# Patient Record
Sex: Male | Born: 1965 | Race: White | Hispanic: No | Marital: Married | Smoking: Never smoker
Health system: Southern US, Community
[De-identification: ages and names within clinical notes are randomized; demographics above are authoritative.]

## PROBLEM LIST (undated history)

## (undated) DIAGNOSIS — K5792 Diverticulitis of intestine, part unspecified, without perforation or abscess without bleeding: Secondary | ICD-10-CM

## (undated) DIAGNOSIS — I10 Essential (primary) hypertension: Secondary | ICD-10-CM

## (undated) DIAGNOSIS — E7800 Pure hypercholesterolemia, unspecified: Secondary | ICD-10-CM

## (undated) DIAGNOSIS — G43909 Migraine, unspecified, not intractable, without status migrainosus: Secondary | ICD-10-CM

## (undated) DIAGNOSIS — N200 Calculus of kidney: Secondary | ICD-10-CM

## (undated) HISTORY — PX: SHOULDER SURGERY: SHX246

## (undated) HISTORY — DX: Migraine, unspecified, not intractable, without status migrainosus: G43.909

## (undated) HISTORY — PX: OTHER SURGICAL HISTORY: SHX169

## (undated) HISTORY — PX: ROTATOR CUFF REPAIR: SHX139

## (undated) HISTORY — DX: Diverticulitis of intestine, part unspecified, without perforation or abscess without bleeding: K57.92

## (undated) HISTORY — DX: Essential (primary) hypertension: I10

## (undated) HISTORY — DX: Calculus of kidney: N20.0

## (undated) HISTORY — DX: Pure hypercholesterolemia, unspecified: E78.00

---

## 2005-01-25 ENCOUNTER — Other Ambulatory Visit: Payer: Self-pay

## 2005-01-26 ENCOUNTER — Inpatient Hospital Stay: Payer: Self-pay | Admitting: Surgery

## 2005-03-14 ENCOUNTER — Ambulatory Visit: Payer: Self-pay | Admitting: Anesthesiology

## 2005-09-23 ENCOUNTER — Emergency Department: Payer: Self-pay | Admitting: Emergency Medicine

## 2005-09-23 ENCOUNTER — Other Ambulatory Visit: Payer: Self-pay

## 2008-11-17 ENCOUNTER — Ambulatory Visit (HOSPITAL_BASED_OUTPATIENT_CLINIC_OR_DEPARTMENT_OTHER): Admission: RE | Admit: 2008-11-17 | Discharge: 2008-11-17 | Payer: Self-pay | Admitting: Orthopedic Surgery

## 2009-07-27 ENCOUNTER — Emergency Department: Payer: Self-pay | Admitting: Emergency Medicine

## 2009-08-03 ENCOUNTER — Ambulatory Visit: Payer: Self-pay | Admitting: Urology

## 2010-11-21 LAB — BASIC METABOLIC PANEL
Calcium: 9.2 mg/dL (ref 8.4–10.5)
GFR calc non Af Amer: >60 mL/min (ref >60)
Glucose, Bld: 94 mg/dL (ref 70–99)
Potassium: 4.8 mEq/L (ref 3.5–5.1)
Sodium: 141 mEq/L (ref 135–145)

## 2010-11-21 LAB — POCT HEMOGLOBIN-HEMACUE: Hemoglobin: 15.1 g/dL (ref 13.0–17.0)

## 2010-12-25 NOTE — Op Note (Signed)
NAME:  Francisco Harvey, Francisco Harvey NO.:  [id number]   MEDICAL RECORD NO.:  [id number]          PATIENT TYPE:  AMB   LOCATION:  DSC                          FACILITY:  [facility]   PHYSICIAN:  Loreta Ave, M.D. DATE OF BIRTH:  [birth year]   DATE OF PROCEDURE:  DATE OF DISCHARGE:                               OPERATIVE REPORT   PREOPERATIVE DIAGNOSES:  Right shoulder impingement, rotator cuff tear,  distal clavicle osteolysis, posterior labrum tear.   POSTOPERATIVE DIAGNOSIS:  Right shoulder impingement, rotator cuff tear,  distal clavicle osteolysis, posterior labrum tear.   PROCEDURE:  Right shoulder exam under anesthesia, arthroscopy with  debridement of labrum and rotator cuff.  Acromioplasty, coracoacromial  ligament release, excision of distal clavicle, and arthroscopic assisted  rotator cuff repair with fiber weave suture x2, swivel lock anchors x2.   SURGEON:  Loreta Ave, MD   ASSISTANT:  Genene Churn. Barry Dienes, PA present throughout the entire case and  necessary for timely completion of the procedure.   ANESTHESIA:  General   BLOOD LOSS:  Minimal.   SPECIMENS:  None.   CULTURES:  None.   COMPLICATIONS:  None.   DRESSINGS:  Soft compressive with shoulder immobilizer.   PROCEDURE IN DETAIL:  The patient brought to the operating room, placed  on the operating table in supine position.  After adequate anesthesia  had been obtained, shoulder examined.  Full motion, stable shoulder.  Placed in beach-chair position on the shoulder positioner, and prepped  and draped in usual sterile fashion.  Three portals, anterior,  posterior, and lateral.  Shoulder entered with blunt obturator.  Arthroscope introduced, shoulder distended and inspected.  Posterior  labrum tear debrided.  Articular cartilage, biceps tendon, biceps  anchor, capsular ligamentous structure intact.  Bottom of the cuff very  thin but the capsular layer still intact.  Cannula redirected  subacromially.  Functional full-thickness tear in entire supraspinatus  tendon.  The capsule just to the bottom was the only thing intact.  Debrided, mobilized for repair.  Tuberosity to roughened bone.  Acromioplasty from a type 3 to a type 1 acromion with shaver and high-  speed bur.  CA ligament released with cautery.  Distal clavicle grade 4  changes marked spurring.  Periarticular spurs and lateral centimeter of  clavicle resected.  Adequacy of decompression confirmed viewing from all  portals.  Cannula lateral anteriorly.  Cuff mobilized.  Captured with  two horizontal mattress fiber weave sutures with a scorpion device.  Brought out sequentially anterior and then back lateral.  Firmly  anchored down the tuberosity with two swivel lock anchors.  At  completion, adequacy of repair  confirmed.  Instruments and fluid removed.  Portals of shoulder and  bursa injected with Marcaine.  Portals closed with 4-0 nylon.  Sterile  compressive dressing applied.  Anesthesia reversed.  Brought to the  recovery room.  Tolerated surgery well.  No complications.      Loreta Ave, M.D.  Electronically Signed     DFM/MEDQ  D:  11/17/2008  T:  11/18/2008  Job:  [id number]

## 2014-03-10 ENCOUNTER — Other Ambulatory Visit: Payer: Self-pay | Admitting: *Deleted

## 2014-03-10 ENCOUNTER — Ambulatory Visit (INDEPENDENT_AMBULATORY_CARE_PROVIDER_SITE_OTHER): Payer: BC Managed Care – PPO

## 2014-03-10 ENCOUNTER — Encounter: Payer: Self-pay | Admitting: Podiatry

## 2014-03-10 ENCOUNTER — Ambulatory Visit (INDEPENDENT_AMBULATORY_CARE_PROVIDER_SITE_OTHER): Payer: BC Managed Care – PPO | Admitting: Podiatry

## 2014-03-10 VITALS — BP 131/76 | HR 55 | Resp 16 | Ht 70.0 in | Wt 190.0 lb

## 2014-03-10 DIAGNOSIS — M779 Enthesopathy, unspecified: Secondary | ICD-10-CM

## 2014-03-10 DIAGNOSIS — M21619 Bunion of unspecified foot: Secondary | ICD-10-CM

## 2014-03-10 DIAGNOSIS — M205X9 Other deformities of toe(s) (acquired), unspecified foot: Secondary | ICD-10-CM

## 2014-03-10 DIAGNOSIS — M205X1 Other deformities of toe(s) (acquired), right foot: Secondary | ICD-10-CM

## 2014-03-10 DIAGNOSIS — M2010 Hallux valgus (acquired), unspecified foot: Secondary | ICD-10-CM

## 2014-03-10 DIAGNOSIS — M898X9 Other specified disorders of bone, unspecified site: Secondary | ICD-10-CM

## 2014-03-10 NOTE — Progress Notes (Signed)
   Subjective:    Patient ID: Francisco Harvey, male    DOB: Sep 04, 1965, 48 y.o.   MRN: [id number]  HPI Comments: Seen dr Al Corpushyatt years ago for this left foot, the toe was hurting in the joint and now recently it has started in the right foot. i am ok to run but walking hurts   Foot Pain Associated symptoms include headaches.      Review of Systems  Neurological: Positive for headaches.  All other systems reviewed and are negative.      Objective:   Physical Exam: I have reviewed her past medical history medications allergies surgeries social history and review of systems. Pulses are strongly palpable bilateral. Neurologic sensorium is intact per Semmes-Weinstein monofilament. Deep tendon reflexes are intact bilateral and muscle strength is 5 over 5 dorsiflexors plantar flexors inverters everters all intrinsic musculature is intact. Orthopedic evaluation demonstrates all joints distal to the ankle a full range of motion without crepitation. This is with the exception of the first metatarsophalangeal joint of the left foot which is limited to approximately 10 of dorsiflexion. He also has pain on in range of motion of this joint. The right foot does demonstrate mild edema overlying the first metatarsophalangeal joint of the right foot. It also demonstrates a near pain on range of motion and on end range of motion. Radiographic evaluation of the bilateral foot does demonstrates severe osteoarthritis and joint space narrowing and subchondral sclerosis dorsal spurring first metatarsophalangeal joint left foot. Currently we have no joint space loss to the right foot only a mild elevated first metatarsal resulting in a hallux limitus.        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  Plan: Discussed etiology pathology conservative versus surgical therapies at this point in time we  discussed injecting the first metatarsophalangeal joint with Kenalog and local anesthetic he declined this. He stated that he would prefer ibuprofen over an injection. I will followup with him as needed.

## 2016-03-05 ENCOUNTER — Other Ambulatory Visit: Payer: Self-pay | Admitting: Physician Assistant

## 2016-03-05 NOTE — Telephone Encounter (Signed)
Is this your patient?  Thanks,   -Vernona Rieger

## 2016-03-20 ENCOUNTER — Other Ambulatory Visit: Payer: Self-pay | Admitting: Physician Assistant

## 2018-03-10 ENCOUNTER — Emergency Department
Admission: EM | Admit: 2018-03-10 | Discharge: 2018-03-10 | Disposition: A | Discharge status: Home / Self Care | Payer: BLUE CROSS/BLUE SHIELD | Attending: Emergency Medicine | Admitting: Emergency Medicine

## 2018-03-10 ENCOUNTER — Emergency Department: Payer: BLUE CROSS/BLUE SHIELD

## 2018-03-10 ENCOUNTER — Other Ambulatory Visit: Payer: Self-pay

## 2018-03-10 DIAGNOSIS — Y9389 Activity, other specified: Secondary | ICD-10-CM | POA: Diagnosis not present

## 2018-03-10 DIAGNOSIS — X500XXA Overexertion from strenuous movement or load, initial encounter: Secondary | ICD-10-CM | POA: Insufficient documentation

## 2018-03-10 DIAGNOSIS — I10 Essential (primary) hypertension: Secondary | ICD-10-CM | POA: Insufficient documentation

## 2018-03-10 DIAGNOSIS — Z79899 Other long term (current) drug therapy: Secondary | ICD-10-CM | POA: Insufficient documentation

## 2018-03-10 DIAGNOSIS — Y929 Unspecified place or not applicable: Secondary | ICD-10-CM | POA: Diagnosis not present

## 2018-03-10 DIAGNOSIS — S39012A Strain of muscle, fascia and tendon of lower back, initial encounter: Secondary | ICD-10-CM | POA: Diagnosis not present

## 2018-03-10 DIAGNOSIS — S3992XA Unspecified injury of lower back, initial encounter: Secondary | ICD-10-CM | POA: Diagnosis present

## 2018-03-10 DIAGNOSIS — Y999 Unspecified external cause status: Secondary | ICD-10-CM | POA: Insufficient documentation

## 2018-03-10 MED ORDER — PREDNISONE 10 MG (21) PO TBPK: ORAL_TABLET | ORAL | 0 refills | Status: DC

## 2018-03-10 MED ORDER — MORPHINE SULFATE (PF) 4 MG/ML IV SOLN
4.0000 mg | Freq: Once | INTRAVENOUS | Status: DC
Start: 2018-03-10 — End: 2018-03-10
  Filled 2018-03-10: qty 1

## 2018-03-10 MED ORDER — TRAMADOL HCL 50 MG PO TABS: 50.0000 mg | ORAL_TABLET | Freq: Four times a day (QID) | ORAL | 0 refills | Status: DC | PRN

## 2018-03-10 MED ORDER — BACLOFEN 10 MG PO TABS: 10.0000 mg | ORAL_TABLET | Freq: Every day | ORAL | 1 refills | Status: AC

## 2018-03-10 MED ORDER — KETOROLAC TROMETHAMINE 30 MG/ML IJ SOLN
30.0000 mg | Freq: Once | INTRAMUSCULAR | Status: AC
  Administered 2018-03-10: 30 mg via INTRAVENOUS
  Filled 2018-03-10: qty 1

## 2018-03-10 MED ORDER — ORPHENADRINE CITRATE 30 MG/ML IJ SOLN
60.0000 mg | Freq: Two times a day (BID) | INTRAMUSCULAR | Status: DC
  Administered 2018-03-10: 60 mg via INTRAVENOUS
  Filled 2018-03-10: qty 2

## 2018-03-10 MED ORDER — ONDANSETRON HCL 4 MG/2ML IJ SOLN
4.0000 mg | Freq: Once | INTRAMUSCULAR | Status: DC
  Filled 2018-03-10: qty 2

## 2018-03-10 NOTE — Discharge Instructions (Addendum)
Follow-up with your regular doctor if not better in 5 to 7 days.  Apply ice to the lower back.  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  You may return on Sunday if you are improving.  Be careful with lifting and try to work on your core muscles.  Consider chiropractor if there is no improvement in 1 to 2 days

## 2018-03-10 NOTE — ED Triage Notes (Signed)
Pt states he was bending and reaching for something about an hour PTA and is having severe pain to the lower back. States he has had issues with his back off and on but never required surgery.

## 2018-03-10 NOTE — ED Notes (Signed)
See triage note  Presents with lower back pain this am  States he developed a sharp pain in back while reaching for paper in his bathroom  States he was not able to stand on his own

## 2018-03-10 NOTE — ED Provider Notes (Signed)
[facility] Emergency Department Provider Note  ____________________________________________   First MD Initiated Contact with Patient 03/10/18 [phone]     (approximate)  I have reviewed the triage vital signs and the nursing notes.   HISTORY  Chief Complaint Back Pain    HPI Horace PorteousDonald L Yeomans is a 52 y.o. male resents emergency department with C/o low back pain for 1 day, positive known injury, states he was sitting on the toilet and reached over for the toilet paper and his back went out.  His son had to help him off the toilet.  Pain is worse with movement, increased with bending over, better with standing, denies numbness, tingling, or changes in bowel/urinary habits,  Using otc meds without relief Remainder ros neg   Past Medical History:  Diagnosis Date  . Diverticulitis   . Hypertension   . Migraines     There are no active problems to display for this patient.   Past Surgical History:  Procedure Laterality Date  . knee surgery    . ROTATOR CUFF REPAIR    . SHOULDER SURGERY      Prior to Admission medications   Medication Sig Start Date End Date Taking? Authorizing Provider  atenolol (TENORMIN) 50 MG tablet Take 50 mg by mouth daily.   Yes [provider]  atorvastatin (LIPITOR) 10 MG tablet Take 10 mg by mouth daily.   Yes [provider]  SUMAtriptan (IMITREX) 100 MG tablet Take 100 mg by mouth every 2 (two) hours as needed for migraine. May repeat in 2 hours if headache persists or recurs.   Yes [provider]    Allergies Patient has no known allergies.  No family history on file.  Social History Social History   Tobacco Use  . Smoking status: Never Smoker  . Smokeless tobacco: Never Used  Substance Use Topics  . Alcohol use: Not Currently  . Drug use: Not Currently    Review of Systems  Constitutional: No fever/chills Eyes: No visual changes. ENT: No sore throat. Respiratory: Denies  cough Genitourinary: Negative for dysuria. Musculoskeletal: Positive for back pain. Skin: Negative for rash.    ____________________________________________   PHYSICAL EXAM:  VITAL SIGNS: ED Triage Vitals  Enc Vitals Group     BP 03/10/18 0932 (!) 147/93     Pulse Rate 03/10/18 0932 60     Resp 03/10/18 0932 18     Temp 03/10/18 0932 97.8 F (36.6 C)     Temp Source 03/10/18 0932 Oral     SpO2 03/10/18 0932 96 %     Weight 03/10/18 0929 200 lb (90.7 kg)     Height 03/10/18 0929 5\' 10"  (1.778 m)     Head Circumference --      Peak Flow --      Pain Score 03/10/18 0928 4     Pain Loc --      Pain Edu? --      Excl. in GC? --     Constitutional: Alert and oriented. Well appearing and in no acute distress. Eyes: Conjunctivae are normal.  Head: Atraumatic. Nose: No congestion/rhinnorhea. Mouth/Throat: Mucous membranes are moist.   Neck:  supple no lymphadenopathy noted Cardiovascular: Normal rate, regular rhythm. Heart sounds are normal Respiratory: Normal respiratory effort.  No retractions, lungs c t a  Abd: soft nontender bs normal all 4 quad GU: deferred Musculoskeletal: FROM all extremities, warm and well perfused.  Decreased rom of back due to discomfort, lumbar spine tender  at L5 area, negative Slr, full strength in great toes b/l, full strength in lower legs, n/v intact Neurologic:  Normal speech and language.  Skin:  Skin is warm, dry and intact. No rash noted. Psychiatric: Mood and affect are normal. Speech and behavior are normal.  ____________________________________________   LABS (all labs ordered are listed, but only abnormal results are displayed)  Labs Reviewed - No data to display ____________________________________________   ____________________________________________  RADIOLOGY  X-ray of the lumbar spine shows some degenerative changes at L5-S1 but no acute  abnormality  ____________________________________________   PROCEDURES  Procedure(s) performed: Saline lock, Toradol 30 mg IV, Norflex 60 mg IV, morphine 4 mg IV and Zofran 4 mg IV  Procedures    ____________________________________________   INITIAL IMPRESSION / ASSESSMENT AND PLAN / ED COURSE  Pertinent labs & imaging results that were available during my care of the patient were reviewed by me and considered in my medical decision making (see chart for details).   Patient is a 52 year old male presents emergency department complaining of low back pain.  He states he was sitting on the toilet and reached over for the toilet paper in his back went out.  His son had to help him off the toilet.  He has had increased pain with movement.  Relieved by standing.  He denies numbness or tingling in the extremities.  On physical exam patient appears well.  The lumbar spine is mildly tender to palpation around L5.  He has decreased range of motion basically due to discomfort.  Negative straight leg raise.  Full strength in the lower extremities.  He is able to ambulate without a foot drop.  X-ray of the lumbar spine shows degenerative changes but no acute abnormality  Patient was given Toradol 30 mg IV and Norflex 60 mg IV.  He states he does not really see any difference after this medication.  He was given morphine 4 mg IV and Zofran 4 mg IV.  Discussed all of the x-ray results and the course of treatment with the patient.  He will be given a steroid pack and muscle relaxer.  Tramadol for pain as needed.  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  Told him I have concerns about him doing any heavy lifting until he returns to his job on Sunday for his regular shift.  He states he understands will comply.  He was discharged in stable condition.     As part of my medical decision making, I reviewed the following data within the electronic MEDICAL RECORD NUMBER Nursing notes  reviewed and incorporated, Old chart reviewed, Radiograph reviewed x-ray lumbar spine is negative, Notes from prior ED visits and [place] Controlled Substance Database  ____________________________________________   FINAL CLINICAL IMPRESSION(S) / ED DIAGNOSES  Final diagnoses:  Strain of lumbar region, initial encounter      NEW MEDICATIONS STARTED DURING THIS VISIT:  Current Discharge Medication List       Note:  This document was prepared using Dragon voice recognition software and may include unintentional dictation errors.     Faythe Ghee, PA-C 03/10/18 1205    Schaevitz, Myra Rude, MD 03/10/18 [phone]

## 2019-05-12 ENCOUNTER — Ambulatory Visit: Payer: Self-pay

## 2019-05-12 DIAGNOSIS — Z23 Encounter for immunization: Secondary | ICD-10-CM

## 2019-05-27 ENCOUNTER — Other Ambulatory Visit: Payer: Self-pay

## 2019-05-27 ENCOUNTER — Ambulatory Visit: Payer: Self-pay

## 2019-05-27 DIAGNOSIS — Z0000 Encounter for general adult medical examination without abnormal findings: Secondary | ICD-10-CM

## 2019-05-27 LAB — POCT URINALYSIS DIPSTICK
Bilirubin, UA: NEGATIVE
Blood, UA: NEGATIVE
Glucose, UA: NEGATIVE
Ketones, UA: NEGATIVE
Leukocytes, UA: NEGATIVE
Nitrite, UA: NEGATIVE
Protein, UA: NEGATIVE
Spec Grav, UA: 1.02 (ref 1.010–1.025)
Urobilinogen, UA: 0.2 E.U./dL
pH, UA: 6 (ref 5.0–8.0)

## 2019-05-28 LAB — CMP12+LP+TP+TSH+6AC+PSA+CBC…
ALT: 31 IU/L (ref 0–44)
AST: 23 IU/L (ref 0–40)
Alkaline Phosphatase: 87 IU/L (ref 39–117)
BUN/Creatinine Ratio: 18 (ref 9–20)
Basos: 0 %
Bilirubin Total: 0.5 mg/dL (ref 0.0–1.2)
Calcium: 8.6 mg/dL — ABNORMAL LOW (ref 8.7–10.2)
Chol/HDL Ratio: 3.9 ratio (ref 0.0–5.0)
Creatinine, Ser: 0.89 mg/dL (ref 0.76–1.27)
Eos: 2 %
Free Thyroxine Index: 1.2 (ref 1.2–4.9)
GFR calc Af Amer: 113 mL/min/{1.73_m2} (ref >59)
GGT: 27 IU/L (ref 0–65)
Globulin, Total: 2.4 g/dL (ref 1.5–4.5)
Glucose: 113 mg/dL — ABNORMAL HIGH (ref 65–99)
HDL: 44 mg/dL (ref >39)
Iron: 116 ug/dL (ref 38–169)
LDH: 162 IU/L (ref 121–224)
Lymphocytes Absolute: 1.3 10*3/uL (ref 0.7–3.1)
Lymphs: 30 %
MCHC: 34.3 g/dL (ref 31.5–35.7)
Monocytes Absolute: 0.4 10*3/uL (ref 0.1–0.9)
Monocytes: 9 %
Phosphorus: 3 mg/dL (ref 2.8–4.1)
Potassium: 4.6 mmol/L (ref 3.5–5.2)
Prostate Specific Ag, Serum: 0.9 ng/mL (ref 0.0–4.0)
RBC: 4.69 x10E6/uL (ref 4.14–5.80)
RDW: 12.2 % (ref 11.6–15.4)
Total Protein: 6.8 g/dL (ref 6.0–8.5)
Uric Acid: 5.1 mg/dL (ref 3.7–8.6)
VLDL Cholesterol Cal: 19 mg/dL (ref 5–40)
WBC: 4.4 10*3/uL (ref 3.4–10.8)

## 2019-05-28 LAB — CMP12+LP+TP+TSH+6AC+PSA+CBC?
Albumin/Globulin Ratio: 1.8 (ref 1.2–2.2)
Albumin: 4.4 g/dL (ref 3.8–4.9)
BUN: 16 mg/dL (ref 6–24)
Basophils Absolute: 0 10*3/uL (ref 0.0–0.2)
Chloride: 104 mmol/L (ref 96–106)
Cholesterol, Total: 171 mg/dL (ref 100–199)
EOS (ABSOLUTE): 0.1 10*3/uL (ref 0.0–0.4)
Estimated CHD Risk: 0.7 times avg. (ref 0.0–1.0)
GFR calc non Af Amer: 98 mL/min/{1.73_m2} (ref >59)
Hematocrit: 43.7 % (ref 37.5–51.0)
Hemoglobin: 15 g/dL (ref 13.0–17.7)
Immature Grans (Abs): 0 10*3/uL (ref 0.0–0.1)
Immature Granulocytes: 0 %
LDL Chol Calc (NIH): 108 mg/dL — ABNORMAL HIGH (ref 0–99)
MCH: 32 pg (ref 26.6–33.0)
MCV: 93 fL (ref 79–97)
Neutrophils Absolute: 2.6 10*3/uL (ref 1.4–7.0)
Neutrophils: 59 %
Platelets: 205 10*3/uL (ref 150–450)
Sodium: 138 mmol/L (ref 134–144)
T3 Uptake Ratio: 27 % (ref 24–39)
T4, Total: 4.3 ug/dL — ABNORMAL LOW (ref 4.5–12.0)
TSH: 2.3 u[IU]/mL (ref 0.450–4.500)
Triglycerides: 105 mg/dL (ref 0–149)

## 2019-06-17 ENCOUNTER — Telehealth: Payer: Self-pay

## 2019-06-17 NOTE — Telephone Encounter (Signed)
Patient called to request refill of blood pressure medication.  NP will call bp meds into pharmacy for patient.

## 2019-06-18 ENCOUNTER — Telehealth: Payer: Self-pay | Admitting: Physician Assistant

## 2019-07-07 ENCOUNTER — Other Ambulatory Visit: Payer: Self-pay

## 2019-07-07 DIAGNOSIS — Z20822 Contact with and (suspected) exposure to covid-19: Secondary | ICD-10-CM

## 2019-07-09 LAB — NOVEL CORONAVIRUS, NAA: SARS-CoV-2, NAA: NOT DETECTED

## 2019-07-26 ENCOUNTER — Encounter: Payer: Self-pay | Admitting: Occupational Medicine

## 2019-07-26 ENCOUNTER — Ambulatory Visit: Payer: Managed Care, Other (non HMO) | Admitting: Occupational Medicine

## 2019-07-26 ENCOUNTER — Other Ambulatory Visit: Payer: Self-pay

## 2019-07-26 VITALS — BP 130/82 | HR 59 | Temp 98.3°F | Ht 70.0 in | Wt 215.0 lb

## 2019-07-26 DIAGNOSIS — Z021 Encounter for pre-employment examination: Secondary | ICD-10-CM

## 2019-07-26 DIAGNOSIS — I10 Essential (primary) hypertension: Secondary | ICD-10-CM

## 2019-07-26 DIAGNOSIS — Z0000 Encounter for general adult medical examination without abnormal findings: Secondary | ICD-10-CM

## 2019-07-26 MED ORDER — ATENOLOL 50 MG PO TABS: 50.0000 mg | ORAL_TABLET | Freq: Every day | ORAL | 3 refills | Status: DC

## 2019-07-26 NOTE — Progress Notes (Signed)
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  Darlin Priestly, MHS, PA-C 08/02/2019  Refill of Atenolol provided.  This note is not being shared with the patient for the following reason: [employer] / work required physical.

## 2019-07-30 NOTE — Addendum Note (Signed)
Addended by: Aliene Altes on: 07/30/2019 09:33 AM   Modules accepted: Orders

## 2019-08-23 DIAGNOSIS — M5387 Other specified dorsopathies, lumbosacral region: Secondary | ICD-10-CM | POA: Diagnosis not present

## 2019-08-23 DIAGNOSIS — M9904 Segmental and somatic dysfunction of sacral region: Secondary | ICD-10-CM | POA: Diagnosis not present

## 2019-08-23 DIAGNOSIS — M9903 Segmental and somatic dysfunction of lumbar region: Secondary | ICD-10-CM | POA: Diagnosis not present

## 2019-08-24 DIAGNOSIS — M9903 Segmental and somatic dysfunction of lumbar region: Secondary | ICD-10-CM | POA: Diagnosis not present

## 2019-08-24 DIAGNOSIS — M5387 Other specified dorsopathies, lumbosacral region: Secondary | ICD-10-CM | POA: Diagnosis not present

## 2019-08-24 DIAGNOSIS — M9904 Segmental and somatic dysfunction of sacral region: Secondary | ICD-10-CM | POA: Diagnosis not present

## 2019-08-27 DIAGNOSIS — M9903 Segmental and somatic dysfunction of lumbar region: Secondary | ICD-10-CM | POA: Diagnosis not present

## 2019-08-27 DIAGNOSIS — M9902 Segmental and somatic dysfunction of thoracic region: Secondary | ICD-10-CM | POA: Diagnosis not present

## 2019-08-27 DIAGNOSIS — M9904 Segmental and somatic dysfunction of sacral region: Secondary | ICD-10-CM | POA: Diagnosis not present

## 2019-08-27 DIAGNOSIS — M5387 Other specified dorsopathies, lumbosacral region: Secondary | ICD-10-CM | POA: Diagnosis not present

## 2019-08-30 DIAGNOSIS — M9903 Segmental and somatic dysfunction of lumbar region: Secondary | ICD-10-CM | POA: Diagnosis not present

## 2019-08-30 DIAGNOSIS — M9904 Segmental and somatic dysfunction of sacral region: Secondary | ICD-10-CM | POA: Diagnosis not present

## 2019-08-30 DIAGNOSIS — M5387 Other specified dorsopathies, lumbosacral region: Secondary | ICD-10-CM | POA: Diagnosis not present

## 2019-08-30 DIAGNOSIS — M9902 Segmental and somatic dysfunction of thoracic region: Secondary | ICD-10-CM | POA: Diagnosis not present

## 2019-08-31 NOTE — Telephone Encounter (Signed)
error 

## 2019-09-01 DIAGNOSIS — M9903 Segmental and somatic dysfunction of lumbar region: Secondary | ICD-10-CM | POA: Diagnosis not present

## 2019-09-01 DIAGNOSIS — M5387 Other specified dorsopathies, lumbosacral region: Secondary | ICD-10-CM | POA: Diagnosis not present

## 2019-09-01 DIAGNOSIS — M9902 Segmental and somatic dysfunction of thoracic region: Secondary | ICD-10-CM | POA: Diagnosis not present

## 2019-09-01 DIAGNOSIS — M9904 Segmental and somatic dysfunction of sacral region: Secondary | ICD-10-CM | POA: Diagnosis not present

## 2019-09-07 DIAGNOSIS — M5387 Other specified dorsopathies, lumbosacral region: Secondary | ICD-10-CM | POA: Diagnosis not present

## 2019-09-07 DIAGNOSIS — M9903 Segmental and somatic dysfunction of lumbar region: Secondary | ICD-10-CM | POA: Diagnosis not present

## 2019-09-07 DIAGNOSIS — M9904 Segmental and somatic dysfunction of sacral region: Secondary | ICD-10-CM | POA: Diagnosis not present

## 2019-09-07 DIAGNOSIS — M9902 Segmental and somatic dysfunction of thoracic region: Secondary | ICD-10-CM | POA: Diagnosis not present

## 2019-09-08 DIAGNOSIS — M9903 Segmental and somatic dysfunction of lumbar region: Secondary | ICD-10-CM | POA: Diagnosis not present

## 2019-09-08 DIAGNOSIS — M9902 Segmental and somatic dysfunction of thoracic region: Secondary | ICD-10-CM | POA: Diagnosis not present

## 2019-09-08 DIAGNOSIS — M5387 Other specified dorsopathies, lumbosacral region: Secondary | ICD-10-CM | POA: Diagnosis not present

## 2019-09-08 DIAGNOSIS — M9904 Segmental and somatic dysfunction of sacral region: Secondary | ICD-10-CM | POA: Diagnosis not present

## 2019-09-13 DIAGNOSIS — M9903 Segmental and somatic dysfunction of lumbar region: Secondary | ICD-10-CM | POA: Diagnosis not present

## 2019-09-13 DIAGNOSIS — M9902 Segmental and somatic dysfunction of thoracic region: Secondary | ICD-10-CM | POA: Diagnosis not present

## 2019-09-13 DIAGNOSIS — M9904 Segmental and somatic dysfunction of sacral region: Secondary | ICD-10-CM | POA: Diagnosis not present

## 2019-09-13 DIAGNOSIS — M5387 Other specified dorsopathies, lumbosacral region: Secondary | ICD-10-CM | POA: Diagnosis not present

## 2019-09-14 DIAGNOSIS — M9904 Segmental and somatic dysfunction of sacral region: Secondary | ICD-10-CM | POA: Diagnosis not present

## 2019-09-14 DIAGNOSIS — M9903 Segmental and somatic dysfunction of lumbar region: Secondary | ICD-10-CM | POA: Diagnosis not present

## 2019-09-14 DIAGNOSIS — M5387 Other specified dorsopathies, lumbosacral region: Secondary | ICD-10-CM | POA: Diagnosis not present

## 2019-09-14 DIAGNOSIS — M9902 Segmental and somatic dysfunction of thoracic region: Secondary | ICD-10-CM | POA: Diagnosis not present

## 2019-09-20 ENCOUNTER — Other Ambulatory Visit: Payer: Self-pay

## 2019-09-20 MED ORDER — ATORVASTATIN CALCIUM 10 MG PO TABS: 10.0000 mg | ORAL_TABLET | Freq: Every day | ORAL | 1 refills | Status: DC

## 2019-09-28 DIAGNOSIS — M9902 Segmental and somatic dysfunction of thoracic region: Secondary | ICD-10-CM | POA: Diagnosis not present

## 2019-09-28 DIAGNOSIS — M5387 Other specified dorsopathies, lumbosacral region: Secondary | ICD-10-CM | POA: Diagnosis not present

## 2019-09-28 DIAGNOSIS — M9903 Segmental and somatic dysfunction of lumbar region: Secondary | ICD-10-CM | POA: Diagnosis not present

## 2019-09-28 DIAGNOSIS — M9904 Segmental and somatic dysfunction of sacral region: Secondary | ICD-10-CM | POA: Diagnosis not present

## 2019-09-29 ENCOUNTER — Ambulatory Visit: Payer: Self-pay

## 2019-09-29 ENCOUNTER — Ambulatory Visit: Payer: Self-pay | Admitting: Registered Nurse

## 2019-09-29 ENCOUNTER — Encounter: Payer: Self-pay | Admitting: Registered Nurse

## 2019-09-29 ENCOUNTER — Other Ambulatory Visit: Payer: Self-pay

## 2019-09-29 VITALS — BP 130/80 | HR 74 | Temp 98.0°F | Resp 16 | Ht 70.0 in | Wt 213.0 lb

## 2019-09-29 DIAGNOSIS — I10 Essential (primary) hypertension: Secondary | ICD-10-CM

## 2019-09-29 DIAGNOSIS — Z6830 Body mass index (BMI) 30.0-30.9, adult: Secondary | ICD-10-CM

## 2019-09-29 DIAGNOSIS — M9903 Segmental and somatic dysfunction of lumbar region: Secondary | ICD-10-CM | POA: Diagnosis not present

## 2019-09-29 DIAGNOSIS — M9902 Segmental and somatic dysfunction of thoracic region: Secondary | ICD-10-CM | POA: Diagnosis not present

## 2019-09-29 DIAGNOSIS — M5387 Other specified dorsopathies, lumbosacral region: Secondary | ICD-10-CM | POA: Diagnosis not present

## 2019-09-29 DIAGNOSIS — M9904 Segmental and somatic dysfunction of sacral region: Secondary | ICD-10-CM | POA: Diagnosis not present

## 2019-09-29 MED ORDER — ATENOLOL 50 MG PO TABS: ORAL_TABLET | ORAL | 3 refills | Status: DC

## 2019-09-29 NOTE — Patient Instructions (Signed)
EXERCISE PROGRESSION: TIPS  - Increase the number of repetitions or sets of the exercise. - If using a band, increase the resistance by: - shortening the band or - folding the band in half or - selecting the next color of band. - If using a wrist weight, increase weight until it is challenging. * Always consult with the health care professional who recommended your exercises when increasing your resistance.  Copyright  VHI. All rights reserved.  Exercise Safely  Exercise does not have to hurt to be beneficial. Start easy and build up your effort. Exercise at times of the day when you feel your best. Do not hold your breath; remember to breathe out while working and breathe in while relaxing. May also count out loud while exercising. Exercise should not cause joint pain, but rather muscle fatigue.  Copyright  VHI. All rights reserved.  Preventing Health Risks of Being Overweight Maintaining a healthy body weight is an important part of your overall health. Your healthy body weight depends on your age, gender, and height. Being overweight puts you at risk for many health problems, including:  Heart disease.  Diabetes.  Problems sleeping.  Joint problems. You can make changes to your diet and lifestyle to prevent these risks. Consider working with a health care provider or a dietitian to make these changes. What nutrition changes can be made?   Eat only as much as your body needs. In most cases, this is about 2,000 calories a day, but the amount varies depending on your height, gender, and activity level. Ask your health care provider how many calories you should have each day. Eating more than your body needs on a regular basis can cause you to become overweight or obese.  Eat slowly, and stop eating when you feel full.  Choose healthy foods, including: ? Fruits and vegetables. ? Lean meats. ? Low-fat dairy products. ? High-fiber foods, such as whole grains and beans. ? Healthy  snacks like vegetable sticks, a piece of fruit, or a small amount of yogurt or cheese.  Avoid foods and drinks that are high in sugar, salt (sodium), saturated fat, or trans fat. This includes: ? Many desserts such as candy, cookies, and ice cream. ? Soda. ? Fried foods. ? Processed meats such as hot dogs or lunch meats. ? Prepackaged snack foods. What lifestyle changes can be made?   Exercise for at least 150 minutes a week to prevent weight gain, or as often as recommended by your health care provider. Do moderate-intensity exercise, such as brisk walking. ? Spread it out by exercising for 30 minutes 5 days a week, or in short 10-minute bursts several times a day.  Find other ways to stay active and burn calories, such as yard work or a hobby that involves physical activity.  Get at least 8 hours of sleep each night. When you are well-rested, you are more likely to be active and make healthy choices during the day. To sleep better: ? Try to go to bed and wake up at about the same time every day. ? Keep your bedroom dark, quiet, and cool. ? Make sure that your bed is comfortable. ? Avoid stimulating activities, such as watching television or exercising, for at least one hour before bedtime. Why are these changes important? Eating healthy and being active helps you lose weight and prevent health problems caused by being overweight. Making these changes can also help you manage stress, feel better mentally, and connect with friends and family.  What can happen if changes are not made? Being overweight can affect you for your entire life. You may develop joint or bone problems that make it painful or difficult for you to play sports or do activities you enjoy. Being overweight puts stress on your heart and lungs and can lead to medical problems like diabetes, heart disease, and sleeping problems. Where to find support You can get support for preventing health risks of being overweight  from:  Your health care provider or a dietitian. They can provide guidance about healthy eating and healthy lifestyle choices.  Weight loss support groups, online or in-person. Where to find more information  MyPlate: https://ball-collins.biz/ ? This an online tool that provides personalized recommendations about foods to eat each day.  The Centers for Disease Control and Prevention: AffordableScrapbook.gl ? This resource gives tips for managing weight and having an active lifestyle. Summary  To prevent unhealthy weight gain, it is important to maintain a healthy diet high in vegetables and whole grains, exercise regularly, and get at least 8 hours of sleep each night.  Making these changes helps prevent many long-term (chronic) health conditions that can shorten your life, such as diabetes, heart disease, and stroke. This information is not intended to replace advice given to you by your health care provider. Make sure you discuss any questions you have with your health care provider. Document Revised: 04/21/2019 Document Reviewed: 06/25/2017 Elsevier Patient Education  2020 [employer] DASH Eating Plan DASH stands for "Dietary Approaches to Stop Hypertension." The DASH eating plan is a healthy eating plan that has been shown to reduce high blood pressure (hypertension). It may also reduce your risk for type 2 diabetes, heart disease, and stroke. The DASH eating plan may also help with weight loss. What are tips for following this plan?  General guidelines  Avoid eating more than 2,300 mg (milligrams) of salt (sodium) a day. If you have hypertension, you may need to reduce your sodium intake to 1,500 mg a day.  Limit alcohol intake to no more than 1 drink a day for nonpregnant women and 2 drinks a day for men. One drink equals 12 oz of beer, 5 oz of wine, or 1 oz of hard liquor.  Work with your health care provider to maintain a healthy body weight or to lose weight. Ask what an  ideal weight is for you.  Get at least 30 minutes of exercise that causes your heart to beat faster (aerobic exercise) most days of the week. Activities may include walking, swimming, or biking.  Work with your health care provider or diet and nutrition specialist (dietitian) to adjust your eating plan to your individual calorie needs. Reading food labels   Check food labels for the amount of sodium per serving. Choose foods with less than 5 percent of the Daily Value of sodium. Generally, foods with less than 300 mg of sodium per serving fit into this eating plan.  To find whole grains, look for the word "whole" as the first word in the ingredient list. Shopping  Buy products labeled as "low-sodium" or "no salt added."  Buy fresh foods. Avoid canned foods and premade or frozen meals. Cooking  Avoid adding salt when cooking. Use salt-free seasonings or herbs instead of table salt or sea salt. Check with your health care provider or pharmacist before using salt substitutes.  Do not fry foods. Cook foods using healthy methods such as baking, boiling, grilling, and broiling instead.  Cook with heart-healthy oils, such  as olive, canola, soybean, or sunflower oil. Meal planning  Eat a balanced diet that includes: ? 5 or more servings of fruits and vegetables each day. At each meal, try to fill half of your plate with fruits and vegetables. ? Up to 6-8 servings of whole grains each day. ? Less than 6 oz of lean meat, poultry, or fish each day. A 3-oz serving of meat is about the same size as a deck of cards. One egg equals 1 oz. ? 2 servings of low-fat dairy each day. ? A serving of nuts, seeds, or beans 5 times each week. ? Heart-healthy fats. Healthy fats called Omega-3 fatty acids are found in foods such as flaxseeds and coldwater fish, like sardines, salmon, and mackerel.  Limit how much you eat of the following: ? Canned or prepackaged foods. ? Food that is high in trans fat, such  as fried foods. ? Food that is high in saturated fat, such as fatty meat. ? Sweets, desserts, sugary drinks, and other foods with added sugar. ? Full-fat dairy products.  Do not salt foods before eating.  Try to eat at least 2 vegetarian meals each week.  Eat more home-cooked food and less restaurant, buffet, and fast food.  When eating at a restaurant, ask that your food be prepared with less salt or no salt, if possible. What foods are recommended? The items listed may not be a complete list. Talk with your dietitian about what dietary choices are best for you. Grains Whole-grain or whole-wheat bread. Whole-grain or whole-wheat pasta. Brown rice. [place]. Bulgur. Whole-grain and low-sodium cereals. Pita bread. Low-fat, low-sodium crackers. Whole-wheat flour tortillas. Vegetables Fresh or frozen vegetables (raw, steamed, roasted, or grilled). Low-sodium or reduced-sodium tomato and vegetable juice. Low-sodium or reduced-sodium tomato sauce and tomato paste. Low-sodium or reduced-sodium canned vegetables. Fruits All fresh, dried, or frozen fruit. Canned fruit in natural juice (without added sugar). Meat and other protein foods Skinless chicken or [place]. Ground chicken or [place]. Pork with fat trimmed off. Fish and seafood. Egg whites. Dried beans, peas, or lentils. Unsalted nuts, nut butters, and seeds. Unsalted canned beans. Lean cuts of beef with fat trimmed off. Low-sodium, lean deli meat. Dairy Low-fat (1%) or fat-free (skim) milk. Fat-free, low-fat, or reduced-fat cheeses. Nonfat, low-sodium ricotta or cottage cheese. Low-fat or nonfat yogurt. Low-fat, low-sodium cheese. Fats and oils Soft margarine without trans fats. Vegetable oil. Low-fat, reduced-fat, or light mayonnaise and salad dressings (reduced-sodium). Canola, safflower, olive, soybean, and sunflower oils. Avocado. Seasoning and other foods Herbs. Spices. Seasoning mixes without salt. Unsalted popcorn and pretzels.  Fat-free sweets. What foods are not recommended? The items listed may not be a complete list. Talk with your dietitian about what dietary choices are best for you. Grains Baked goods made with fat, such as croissants, muffins, or some breads. Dry pasta or rice meal packs. Vegetables Creamed or fried vegetables. Vegetables in a cheese sauce. Regular canned vegetables (not low-sodium or reduced-sodium). Regular canned tomato sauce and paste (not low-sodium or reduced-sodium). Regular tomato and vegetable juice (not low-sodium or reduced-sodium). Angie Fava. Olives. Fruits Canned fruit in a light or heavy syrup. Fried fruit. Fruit in cream or butter sauce. Meat and other protein foods Fatty cuts of meat. Ribs. Fried meat. Berniece Salines. Sausage. Bologna and other processed lunch meats. Salami. Fatback. Hotdogs. Bratwurst. Salted nuts and seeds. Canned beans with added salt. Canned or smoked fish. Whole eggs or egg yolks. Chicken or [place] with skin. Dairy Whole or 2% milk, cream, and  half-and-half. Whole or full-fat cream cheese. Whole-fat or sweetened yogurt. Full-fat cheese. Nondairy creamers. Whipped toppings. Processed cheese and cheese spreads. Fats and oils Butter. Stick margarine. Lard. Shortening. Ghee. Bacon fat. Tropical oils, such as coconut, palm kernel, or palm oil. Seasoning and other foods Salted popcorn and pretzels. Onion salt, garlic salt, seasoned salt, table salt, and sea salt. Worcestershire sauce. Tartar sauce. Barbecue sauce. Teriyaki sauce. Soy sauce, including reduced-sodium. Steak sauce. Canned and packaged gravies. Fish sauce. Oyster sauce. Cocktail sauce. Horseradish that you find on the shelf. Ketchup. Mustard. Meat flavorings and tenderizers. Bouillon cubes. Hot sauce and Tabasco sauce. Premade or packaged marinades. Premade or packaged taco seasonings. Relishes. Regular salad dressings. Where to find more information:  National Heart, Lung, and Blood Institute:  PopSteam.is  American Heart Association: www.heart.org Summary  The DASH eating plan is a healthy eating plan that has been shown to reduce high blood pressure (hypertension). It may also reduce your risk for type 2 diabetes, heart disease, and stroke.  With the DASH eating plan, you should limit salt (sodium) intake to 2,300 mg a day. If you have hypertension, you may need to reduce your sodium intake to 1,500 mg a day.  When on the DASH eating plan, aim to eat more fresh fruits and vegetables, whole grains, lean proteins, low-fat dairy, and heart-healthy fats.  Work with your health care provider or diet and nutrition specialist (dietitian) to adjust your eating plan to your individual calorie needs. This information is not intended to replace advice given to you by your health care provider. Make sure you discuss any questions you have with your health care provider. Document Revised: 07/11/2017 Document Reviewed: 07/22/2016 Elsevier Patient Education  2020 [employer]. Managing Your Hypertension Hypertension is commonly called high blood pressure. This is when the force of your blood pressing against the walls of your arteries is too strong. Arteries are blood vessels that carry blood from your heart throughout your body. Hypertension forces the heart to work harder to pump blood, and may cause the arteries to become narrow or stiff. Having untreated or uncontrolled hypertension can cause heart attack, stroke, kidney disease, and other problems. What are blood pressure readings? A blood pressure reading consists of a higher number over a lower number. Ideally, your blood pressure should be below 120/80. The first ("top") number is called the systolic pressure. It is a measure of the pressure in your arteries as your heart beats. The second ("bottom") number is called the diastolic pressure. It is a measure of the pressure in your arteries as the heart relaxes. What does my blood  pressure reading mean? Blood pressure is classified into four stages. Based on your blood pressure reading, your health care provider may use the following stages to determine what type of treatment you need, if any. Systolic pressure and diastolic pressure are measured in a unit called mm Hg. Normal  Systolic pressure: below 120.  Diastolic pressure: below 80. Elevated  Systolic pressure: 120-129.  Diastolic pressure: below 80. Hypertension stage 1  Systolic pressure: 130-139.  Diastolic pressure: 80-89. Hypertension stage 2  Systolic pressure: 140 or above.  Diastolic pressure: 90 or above. What health risks are associated with hypertension? Managing your hypertension is an important responsibility. Uncontrolled hypertension can lead to:  A heart attack.  A stroke.  A weakened blood vessel (aneurysm).  Heart failure.  Kidney damage.  Eye damage.  Metabolic syndrome.  Memory and concentration problems. What changes can I make to manage my  hypertension? Hypertension can be managed by making lifestyle changes and possibly by taking medicines. Your health care provider will help you make a plan to bring your blood pressure within a normal range. Eating and drinking   Eat a diet that is high in fiber and potassium, and low in salt (sodium), added sugar, and fat. An example eating plan is called the DASH (Dietary Approaches to Stop Hypertension) diet. To eat this way: ? Eat plenty of fresh fruits and vegetables. Try to fill half of your plate at each meal with fruits and vegetables. ? Eat whole grains, such as whole wheat pasta, brown rice, or whole grain bread. Fill about one quarter of your plate with whole grains. ? Eat low-fat diary products. ? Avoid fatty cuts of meat, processed or cured meats, and poultry with skin. Fill about one quarter of your plate with lean proteins such as fish, chicken without skin, beans, eggs, and tofu. ? Avoid premade and processed foods.  These tend to be higher in sodium, added sugar, and fat.  Reduce your daily sodium intake. Most people with hypertension should eat less than 1,500 mg of sodium a day.  Limit alcohol intake to no more than 1 drink a day for nonpregnant women and 2 drinks a day for men. One drink equals 12 oz of beer, 5 oz of wine, or 1 oz of hard liquor. Lifestyle  Work with your health care provider to maintain a healthy body weight, or to lose weight. Ask what an ideal weight is for you.  Get at least 30 minutes of exercise that causes your heart to beat faster (aerobic exercise) most days of the week. Activities may include walking, swimming, or biking.  Include exercise to strengthen your muscles (resistance exercise), such as weight lifting, as part of your weekly exercise routine. Try to do these types of exercises for 30 minutes at least 3 days a week.  Do not use any products that contain nicotine or tobacco, such as cigarettes and e-cigarettes. If you need help quitting, ask your health care provider.  Control any long-term (chronic) conditions you have, such as high cholesterol or diabetes. Monitoring  Monitor your blood pressure at home as told by your health care provider. Your personal target blood pressure may vary depending on your medical conditions, your age, and other factors.  Have your blood pressure checked regularly, as often as told by your health care provider. Working with your health care provider  Review all the medicines you take with your health care provider because there may be side effects or interactions.  Talk with your health care provider about your diet, exercise habits, and other lifestyle factors that may be contributing to hypertension.  Visit your health care provider regularly. Your health care provider can help you create and adjust your plan for managing hypertension. Will I need medicine to control my blood pressure? Your health care provider may prescribe  medicine if lifestyle changes are not enough to get your blood pressure under control, and if:  Your systolic blood pressure is 130 or higher.  Your diastolic blood pressure is 80 or higher. Take medicines only as told by your health care provider. Follow the directions carefully. Blood pressure medicines must be taken as prescribed. The medicine does not work as well when you skip doses. Skipping doses also puts you at risk for problems. Contact a health care provider if:  You think you are having a reaction to medicines you have taken.  You  have repeated (recurrent) headaches.  You feel dizzy.  You have swelling in your ankles.  You have trouble with your vision. Get help right away if:  You develop a severe headache or confusion.  You have unusual weakness or numbness, or you feel faint.  You have severe pain in your chest or abdomen.  You vomit repeatedly.  You have trouble breathing. Summary  Hypertension is when the force of blood pumping through your arteries is too strong. If this condition is not controlled, it may put you at risk for serious complications.  Your personal target blood pressure may vary depending on your medical conditions, your age, and other factors. For most people, a normal blood pressure is less than 120/80.  Hypertension is managed by lifestyle changes, medicines, or both. Lifestyle changes include weight loss, eating a healthy, low-sodium diet, exercising more, and limiting alcohol. This information is not intended to replace advice given to you by your health care provider. Make sure you discuss any questions you have with your health care provider. Document Revised: 11/20/2018 Document Reviewed: 06/26/2016 Elsevier Patient Education  2020 [employer].

## 2019-09-29 NOTE — Progress Notes (Signed)
Called 09/28/2019 to inform us that BP has been 160/100' everytime he's checked it today.  BP 165/110 at home yesterday evening. Took 1/2 tab of atenolol (25 mg)& rechecked hs & it was 142/96.  This morning it was 120/90 before taking his daily medications.  Sees Dr. 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  AMD

## 2019-09-29 NOTE — Progress Notes (Addendum)
Subjective:    Patient ID: Francisco Harvey, male    DOB: [birth year], 54 y.o.   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  Average steps previous year on phone tracker 7100.  Has gained 10 lbs over past 3 years per chart review.  Felt better after taking 25mg  atenolol pm and his regular 50mg  in am.  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  PMHx exercise induced asthma hasn't used inhaler since age 27     Review of Systems  Constitutional: Negative for activity change, appetite change, chills, diaphoresis, fatigue, fever and unexpected weight change.  HENT: Negative for congestion, ear pain, facial swelling, hearing loss, postnasal drip, rhinorrhea, sinus pressure, sinus pain, sore throat, tinnitus and trouble swallowing.   Eyes: Negative for photophobia, pain, discharge and visual disturbance.  Respiratory: Negative for cough, choking, chest tightness, shortness of breath, wheezing and stridor.   Cardiovascular: Negative for chest pain and leg swelling.  Gastrointestinal: Negative for abdominal distention, abdominal pain, blood in stool, constipation, diarrhea, nausea and vomiting.  Endocrine: Negative for cold intolerance, heat intolerance, polydipsia, polyphagia and polyuria.  Genitourinary: Negative for difficulty urinating.  Musculoskeletal: Negative for arthralgias, back pain, gait problem, joint swelling, myalgias, neck pain and neck stiffness.  Skin: Negative for rash.  Allergic/Immunologic: Negative for environmental allergies and food allergies.  Neurological: Negative for dizziness, tremors, seizures, syncope, facial asymmetry, speech difficulty, weakness, light-headedness, numbness and headaches.  Hematological: Negative for adenopathy. Does not bruise/bleed easily.  Psychiatric/Behavioral: Negative for agitation, confusion and sleep disturbance. The patient is not  nervous/anxious.        Objective:   Physical Exam Constitutional:      General: He is awake. He is not in acute distress.    Appearance: Normal appearance. He is well-developed and well-groomed. He is obese. He is not ill-appearing, toxic-appearing or diaphoretic.  HENT:     Head: Normocephalic and atraumatic.     Jaw: There is normal jaw occlusion.     Salivary Glands: Right salivary gland is not diffusely enlarged or tender. Left salivary gland is not diffusely enlarged or tender.     Right Ear: Hearing and external ear normal. A middle ear effusion is present. There is no impacted cerumen.     Left Ear: Hearing and external ear normal. A middle ear effusion is present. There is no impacted cerumen.     Nose: Nose normal. No congestion or rhinorrhea.     Right Sinus: No maxillary sinus tenderness or frontal sinus tenderness.     Left Sinus: No maxillary sinus tenderness or frontal sinus tenderness.     Mouth/Throat:     Lips: Pink. No lesions.     Mouth: Mucous membranes are moist. No lacerations, oral lesions or angioedema.     Dentition: No gingival swelling, dental abscesses or gum lesions.     Tongue: No lesions. Tongue does not deviate from midline.     Palate: No mass and lesions.     Pharynx: Uvula midline. Pharyngeal swelling and posterior oropharyngeal erythema present. No oropharyngeal exudate or uvula swelling.     Tonsils: No tonsillar exudate.     Comments: Cobblestoning posterior pharynx; bilateral allergic shiners; bilateral TMs air fluid level clear Eyes:     General: Lids are normal. Vision grossly intact. Gaze aligned appropriately. Allergic shiner present. No visual field deficit or scleral icterus.  Right eye: No discharge.        Left eye: No discharge.     Extraocular Movements: Extraocular movements intact.     Conjunctiva/sclera: Conjunctivae normal.     Pupils: Pupils are equal, round, and reactive to light.  Neck:     Thyroid: No thyroid mass,  thyromegaly or thyroid tenderness.     Vascular: No carotid bruit.     Trachea: Trachea and phonation normal.  Cardiovascular:     Rate and Rhythm: Normal rate and regular rhythm.     Pulses: Normal pulses.          Radial pulses are 2+ on the right side and 2+ on the left side.     Heart sounds: Normal heart sounds. No murmur. No friction rub. No gallop.   Pulmonary:     Effort: Pulmonary effort is normal. No accessory muscle usage, prolonged expiration, respiratory distress or retractions.     Breath sounds: Normal air entry. No stridor, decreased air movement or transmitted upper airway sounds. Examination of the right-lower field reveals wheezing. Examination of the left-lower field reveals wheezing. Wheezing present. No decreased breath sounds, rhonchi or rales.     Comments: Bilateral fine wheeze inspiration noted; wearing cloth mask due to covid pandemic; spoke full sentences without difficulty no cough noted in exam room Abdominal:     General: Abdomen is flat. Bowel sounds are normal. There is no distension.     Palpations: Abdomen is soft. There is no shifting dullness, fluid wave, hepatomegaly, splenomegaly, mass or pulsatile mass.     Tenderness: There is no abdominal tenderness. There is no right CVA tenderness, left CVA tenderness, guarding or rebound.     Hernia: No hernia is present.  Musculoskeletal:        General: No swelling, tenderness, deformity or signs of injury. Normal range of motion.     Right shoulder: Normal.     Left shoulder: Normal.     Right elbow: Normal.     Left elbow: Normal.     Right hand: Normal.     Left hand: Normal.     Cervical back: Normal, normal range of motion and neck supple. No swelling, edema, deformity, erythema, signs of trauma, lacerations, rigidity, spasms, torticollis, tenderness, bony tenderness or crepitus. No pain with movement. Normal range of motion.     Thoracic back: Normal.     Lumbar back: Normal.     Right hip: Normal.      Left hip: Normal.     Right knee: Normal.     Left knee: Normal.     Right lower leg: No edema.     Left lower leg: No edema.  Lymphadenopathy:     Head:     Right side of head: No submental, submandibular, tonsillar, preauricular, posterior auricular or occipital adenopathy.     Left side of head: No submental, submandibular, tonsillar, preauricular, posterior auricular or occipital adenopathy.     Cervical: No cervical adenopathy.     Right cervical: No superficial, deep or posterior cervical adenopathy.    Left cervical: No superficial, deep or posterior cervical adenopathy.  Skin:    General: Skin is warm and dry.     Capillary Refill: Capillary refill takes less than 2 seconds.     Coloration: Skin is not ashen, cyanotic, jaundiced, mottled, pale or sallow.     Findings: No abrasion, abscess, acne, bruising, burn, ecchymosis, erythema, signs of injury, laceration, lesion, petechiae, rash or wound.  Nails: There is no clubbing.  Neurological:     General: No focal deficit present.     Mental Status: He is alert and oriented to person, place, and time. Mental status is at baseline.     GCS: GCS eye subscore is 4. GCS verbal subscore is 5. GCS motor subscore is 6.     Cranial Nerves: Cranial nerves are intact. No cranial nerve deficit, dysarthria or facial asymmetry.     Sensory: Sensation is intact. No sensory deficit.     Motor: Motor function is intact. No weakness, tremor, atrophy, abnormal muscle tone or seizure activity.     Coordination: Coordination is intact. Coordination normal.     Gait: Gait is intact. Gait normal.  Psychiatric:        Attention and Perception: Attention and perception normal.        Mood and Affect: Mood and affect normal.        Speech: Speech normal.        Behavior: Behavior normal. Behavior is cooperative.        Thought Content: Thought content normal.        Cognition and Memory: Cognition and memory normal.        Judgment: Judgment normal.      Comments: On/off exam table and in/out of chair without difficulty; gait sure and steady in hallway; bilateral hand grasp equal/upper/lower extremity strength 5/5       sp02 RA 95-96% RA while taking in exam room  Paper chart review  meds atenolol 50mg  daily since 2009 imitrex prn since 2010 lipitor 10mg  po daily since 2017 Aspirin 81mg  daily since 2018 Rotator cuff repair 11/17/2009 Negative stress test 2008 Impaired fasting glucose history Kidney stones labs Apr 29 2018 glucose 108 TC 184 trigs 128 HDL 46 LDL 112; PSA 1.0 TSH 1.6 CBC WNL; Jan 2018 cholesterol/LFTs WNL; glucose 125 and creatinine 1.01; 2016 Hgba1c 5.7; 2017 5.5; total cholesterol 212 trigs 94 LDL 147 HDL 46 LFTs WNL  2015 TC 210 Trigs 89 LDL 147 HDL 45  05/11/2018 Physical with PA 2017 urinalysis normal BP 130/72 FR 70 weight 209 height 5.10' tdap 2012 hepatitis + titer 2012 history of migraines/hat band bilateral prn imitrex; history diverticulitis and exploratory lab 10 years ago; fused C5-C6 1994   FHx: Mother quadruple bypass and DM, Father DM, CAD, CHF, lung cancer nonsmoker  07/26/2019 [facility] physical with PA Singer BP 130/82 HR 59 weight 215 lbs    Assessment & Plan:  A-essential hypertension, BMI 30, PMHx exercise induced asthma, rhinitis  P-Increase atenolol to 75mg  po daily (50am/25 pm) as half life 6 hours per lexicomp.  Patient has enough pills at home to cover new dosing.  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   Goal 110-120s/60-70s.  If still higher than goal will increase dose to 50mg  am/50pm.  Discussed with patient improve diet/exercise so achieving weight loss to lose the weight he has gained over the previous 2 years and will re-evaluate at that time if able to decrease his atenolol dose again. Consider DASH diet and exercise program 150 minutes exercise/activity per week.  Recommended weight loss/weight maintenance to BMI 20-25.  Return to the clinic if any new symptoms/notify clinic staff if visual changes, frequent headache, chest pain or dyspnea on mild or  minimal exertion.  Discussed I recommended optometry evaluations at least every other year.   Exitcare handout on  managing hypertension.  Patient verbalized agreement and understanding of treatment plan and had no further questions at this time. P2: Diet and Exercise specific for HTN  Weight loss increase steps 100 per day per week (total 1000 steps per week).  Loss 10 lbs over the next 12-24 months.  Continue working out at work three days per week.  Be more mindful diet/increase fruits/vegetables and fiber in diet.  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  Patient verbalized understanding information/instructions, agreed with plan of care and had no further questions at this time.  Discussed albuterol inhaler for prn use patient refused as history exercise induced asthma teen years.  Discussed singulair or OTC antihistamine and patient refused also as noted post nasal drip today and bilateral otitis effusion along with basilar wheezing inspiratory on exam today.  Patient refused stated "I don't need them/feeling well" Denied dyspnea/shortness of breath/chest tightness/cough.  Consider starting if notices post nasal drip/cough.  Patient verbalized understanding and had no further questions at this time.  Patient may use normal saline nasal spray 2 sprays each nostril q2h wa as needed. OTC antihistamine of choice claritin/zyrtec 10mg  po daily.   Shower prior to bedtime  Call or return to clinic as needed if these symptoms worsen or fail to improve as anticipated.   Patient verbalized understanding of instructions, agreed with plan of care and had no further questions at this time.  P2:  Avoidance and hand washing.

## 2019-09-30 DIAGNOSIS — I10 Essential (primary) hypertension: Secondary | ICD-10-CM | POA: Insufficient documentation

## 2019-09-30 DIAGNOSIS — Z6830 Body mass index (BMI) 30.0-30.9, adult: Secondary | ICD-10-CM | POA: Insufficient documentation

## 2019-10-04 DIAGNOSIS — M9903 Segmental and somatic dysfunction of lumbar region: Secondary | ICD-10-CM | POA: Diagnosis not present

## 2019-10-04 DIAGNOSIS — M5387 Other specified dorsopathies, lumbosacral region: Secondary | ICD-10-CM | POA: Diagnosis not present

## 2019-10-04 DIAGNOSIS — M9904 Segmental and somatic dysfunction of sacral region: Secondary | ICD-10-CM | POA: Diagnosis not present

## 2019-10-04 DIAGNOSIS — M9902 Segmental and somatic dysfunction of thoracic region: Secondary | ICD-10-CM | POA: Diagnosis not present

## 2019-10-08 ENCOUNTER — Other Ambulatory Visit: Payer: Self-pay | Admitting: Physician Assistant

## 2019-10-08 ENCOUNTER — Other Ambulatory Visit: Payer: Self-pay

## 2019-10-08 DIAGNOSIS — G43901 Migraine, unspecified, not intractable, with status migrainosus: Secondary | ICD-10-CM

## 2019-10-08 MED ORDER — SUMATRIPTAN SUCCINATE 100 MG PO TABS: ORAL_TABLET | ORAL | 0 refills | Status: DC

## 2019-10-08 NOTE — Progress Notes (Signed)
Patient  with hx migraine.treats with Imitrex 100mg    Recently  noted to have gained 10 pounds  Increase in BP noted and HTN RX adjusted  Request  RTC for BP and weight follow up/eval [facility] previous follow up request

## 2019-10-11 ENCOUNTER — Telehealth: Payer: Self-pay

## 2019-10-11 NOTE — Telephone Encounter (Signed)
Called Francisco Harvey.  Informed him sumatriptan Rx renewed on Friday and  L. Nedra Hai, PA-C ([facility] Provider) who renewed the Rx wants him to return to clinic for BP & weight check.  [name] has an appt already scheduled for 10/13/19 at 8:00 am for 2 wk BP follow-up with T. Betancourt, NP-C ([facility] Provider).  AMD

## 2019-10-13 ENCOUNTER — Ambulatory Visit: Payer: 59 | Admitting: Registered Nurse

## 2019-10-13 ENCOUNTER — Other Ambulatory Visit: Payer: Self-pay

## 2019-10-13 VITALS — BP 134/87 | HR 69 | Temp 99.0°F | Wt 215.6 lb

## 2019-10-13 DIAGNOSIS — I10 Essential (primary) hypertension: Secondary | ICD-10-CM

## 2019-10-13 DIAGNOSIS — M9904 Segmental and somatic dysfunction of sacral region: Secondary | ICD-10-CM | POA: Diagnosis not present

## 2019-10-13 DIAGNOSIS — M9902 Segmental and somatic dysfunction of thoracic region: Secondary | ICD-10-CM | POA: Diagnosis not present

## 2019-10-13 DIAGNOSIS — G4489 Other headache syndrome: Secondary | ICD-10-CM

## 2019-10-13 DIAGNOSIS — M5387 Other specified dorsopathies, lumbosacral region: Secondary | ICD-10-CM | POA: Diagnosis not present

## 2019-10-13 DIAGNOSIS — Z6830 Body mass index (BMI) 30.0-30.9, adult: Secondary | ICD-10-CM

## 2019-10-13 DIAGNOSIS — M9903 Segmental and somatic dysfunction of lumbar region: Secondary | ICD-10-CM | POA: Diagnosis not present

## 2019-10-13 MED ORDER — SUMATRIPTAN SUCCINATE 100 MG PO TABS
ORAL_TABLET | ORAL | 7 refills | Status: DC
Start: 2019-10-13 — End: 2020-08-22

## 2019-10-13 MED ORDER — ATENOLOL 50 MG PO TABS: ORAL_TABLET | ORAL | 2 refills | Status: DC

## 2019-10-13 NOTE — Patient Instructions (Addendum)
DASH Eating Plan DASH stands for "Dietary Approaches to Stop Hypertension." The DASH eating plan is a healthy eating plan that has been shown to reduce high blood pressure (hypertension). It may also reduce your risk for type 2 diabetes, heart disease, and stroke. The DASH eating plan may also help with weight loss. What are tips for following this plan?  General guidelines  Avoid eating more than 2,300 mg (milligrams) of salt (sodium) a day. If you have hypertension, you may need to reduce your sodium intake to 1,500 mg a day.  Limit alcohol intake to no more than 1 drink a day for nonpregnant women and 2 drinks a day for men. One drink equals 12 oz of beer, 5 oz of wine, or 1 oz of hard liquor.  Work with your health care provider to maintain a healthy body weight or to lose weight. Ask what an ideal weight is for you.  Get at least 30 minutes of exercise that causes your heart to beat faster (aerobic exercise) most days of the week. Activities may include walking, swimming, or biking.  Work with your health care provider or diet and nutrition specialist (dietitian) to adjust your eating plan to your individual calorie needs. Reading food labels   Check food labels for the amount of sodium per serving. Choose foods with less than 5 percent of the Daily Value of sodium. Generally, foods with less than 300 mg of sodium per serving fit into this eating plan.  To find whole grains, look for the word "whole" as the first word in the ingredient list. Shopping  Buy products labeled as "low-sodium" or "no salt added."  Buy fresh foods. Avoid canned foods and premade or frozen meals. Cooking  Avoid adding salt when cooking. Use salt-free seasonings or herbs instead of table salt or sea salt. Check with your health care provider or pharmacist before using salt substitutes.  Do not fry foods. Cook foods using healthy methods such as baking, boiling, grilling, and broiling instead.  Cook with  heart-healthy oils, such as olive, canola, soybean, or sunflower oil. Meal planning  Eat a balanced diet that includes: ? 5 or more servings of fruits and vegetables each day. At each meal, try to fill half of your plate with fruits and vegetables. ? Up to 6-8 servings of whole grains each day. ? Less than 6 oz of lean meat, poultry, or fish each day. A 3-oz serving of meat is about the same size as a deck of cards. One egg equals 1 oz. ? 2 servings of low-fat dairy each day. ? A serving of nuts, seeds, or beans 5 times each week. ? Heart-healthy fats. Healthy fats called Omega-3 fatty acids are found in foods such as flaxseeds and coldwater fish, like sardines, salmon, and mackerel.  Limit how much you eat of the following: ? Canned or prepackaged foods. ? Food that is high in trans fat, such as fried foods. ? Food that is high in saturated fat, such as fatty meat. ? Sweets, desserts, sugary drinks, and other foods with added sugar. ? Full-fat dairy products.  Do not salt foods before eating.  Try to eat at least 2 vegetarian meals each week.  Eat more home-cooked food and less restaurant, buffet, and fast food.  When eating at a restaurant, ask that your food be prepared with less salt or no salt, if possible. What foods are recommended? The items listed may not be a complete list. Talk with your dietitian about   what dietary choices are best for you. Grains Whole-grain or whole-wheat bread. Whole-grain or whole-wheat pasta. Brown rice. Oatmeal. Quinoa. Bulgur. Whole-grain and low-sodium cereals. Pita bread. Low-fat, low-sodium crackers. Whole-wheat flour tortillas. Vegetables Fresh or frozen vegetables (raw, steamed, roasted, or grilled). Low-sodium or reduced-sodium tomato and vegetable juice. Low-sodium or reduced-sodium tomato sauce and tomato paste. Low-sodium or reduced-sodium canned vegetables. Fruits All fresh, dried, or frozen fruit. Canned fruit in natural juice (without  added sugar). Meat and other protein foods Skinless chicken or turkey. Ground chicken or turkey. Pork with fat trimmed off. Fish and seafood. Egg whites. Dried beans, peas, or lentils. Unsalted nuts, nut butters, and seeds. Unsalted canned beans. Lean cuts of beef with fat trimmed off. Low-sodium, lean deli meat. Dairy Low-fat (1%) or fat-free (skim) milk. Fat-free, low-fat, or reduced-fat cheeses. Nonfat, low-sodium ricotta or cottage cheese. Low-fat or nonfat yogurt. Low-fat, low-sodium cheese. Fats and oils Soft margarine without trans fats. Vegetable oil. Low-fat, reduced-fat, or light mayonnaise and salad dressings (reduced-sodium). Canola, safflower, olive, soybean, and sunflower oils. Avocado. Seasoning and other foods Herbs. Spices. Seasoning mixes without salt. Unsalted popcorn and pretzels. Fat-free sweets. What foods are not recommended? The items listed may not be a complete list. Talk with your dietitian about what dietary choices are best for you. Grains Baked goods made with fat, such as croissants, muffins, or some breads. Dry pasta or rice meal packs. Vegetables Creamed or fried vegetables. Vegetables in a cheese sauce. Regular canned vegetables (not low-sodium or reduced-sodium). Regular canned tomato sauce and paste (not low-sodium or reduced-sodium). Regular tomato and vegetable juice (not low-sodium or reduced-sodium). Pickles. Olives. Fruits Canned fruit in a light or heavy syrup. Fried fruit. Fruit in cream or butter sauce. Meat and other protein foods Fatty cuts of meat. Ribs. Fried meat. Bacon. Sausage. Bologna and other processed lunch meats. Salami. Fatback. Hotdogs. Bratwurst. Salted nuts and seeds. Canned beans with added salt. Canned or smoked fish. Whole eggs or egg yolks. Chicken or turkey with skin. Dairy Whole or 2% milk, cream, and half-and-half. Whole or full-fat cream cheese. Whole-fat or sweetened yogurt. Full-fat cheese. Nondairy creamers. Whipped toppings.  Processed cheese and cheese spreads. Fats and oils Butter. Stick margarine. Lard. Shortening. Ghee. Bacon fat. Tropical oils, such as coconut, palm kernel, or palm oil. Seasoning and other foods Salted popcorn and pretzels. Onion salt, garlic salt, seasoned salt, table salt, and sea salt. Worcestershire sauce. Tartar sauce. Barbecue sauce. Teriyaki sauce. Soy sauce, including reduced-sodium. Steak sauce. Canned and packaged gravies. Fish sauce. Oyster sauce. Cocktail sauce. Horseradish that you find on the shelf. Ketchup. Mustard. Meat flavorings and tenderizers. Bouillon cubes. Hot sauce and Tabasco sauce. Premade or packaged marinades. Premade or packaged taco seasonings. Relishes. Regular salad dressings. Where to find more information:  National Heart, Lung, and Blood Institute: www.nhlbi.nih.gov  American Heart Association: www.heart.org Summary  The DASH eating plan is a healthy eating plan that has been shown to reduce high blood pressure (hypertension). It may also reduce your risk for type 2 diabetes, heart disease, and stroke.  With the DASH eating plan, you should limit salt (sodium) intake to 2,300 mg a day. If you have hypertension, you may need to reduce your sodium intake to 1,500 mg a day.  When on the DASH eating plan, aim to eat more fresh fruits and vegetables, whole grains, lean proteins, low-fat dairy, and heart-healthy fats.  Work with your health care provider or diet and nutrition specialist (dietitian) to adjust your eating plan to your   individual calorie needs. This information is not intended to replace advice given to you by your health care provider. Make sure you discuss any questions you have with your health care provider. Document Revised: 07/11/2017 Document Reviewed: 07/22/2016 Elsevier Patient Education  2020 [employer] Managing Your Hypertension Hypertension is commonly called high blood pressure. This is when the force of your blood pressing against  the walls of your arteries is too strong. Arteries are blood vessels that carry blood from your heart throughout your body. Hypertension forces the heart to work harder to pump blood, and may cause the arteries to become narrow or stiff. Having untreated or uncontrolled hypertension can cause heart attack, stroke, kidney disease, and other problems. What are blood pressure readings? A blood pressure reading consists of a higher number over a lower number. Ideally, your blood pressure should be below 120/80. The first ("top") number is called the systolic pressure. It is a measure of the pressure in your arteries as your heart beats. The second ("bottom") number is called the diastolic pressure. It is a measure of the pressure in your arteries as the heart relaxes. What does my blood pressure reading mean? Blood pressure is classified into four stages. Based on your blood pressure reading, your health care provider may use the following stages to determine what type of treatment you need, if any. Systolic pressure and diastolic pressure are measured in a unit called mm Hg. Normal  Systolic pressure: below 120.  Diastolic pressure: below 80. Elevated  Systolic pressure: 120-129.  Diastolic pressure: below 80. Hypertension stage 1  Systolic pressure: 130-139.  Diastolic pressure: 80-89. Hypertension stage 2  Systolic pressure: 140 or above.  Diastolic pressure: 90 or above. What health risks are associated with hypertension? Managing your hypertension is an important responsibility. Uncontrolled hypertension can lead to:  A heart attack.  A stroke.  A weakened blood vessel (aneurysm).  Heart failure.  Kidney damage.  Eye damage.  Metabolic syndrome.  Memory and concentration problems. What changes can I make to manage my hypertension? Hypertension can be managed by making lifestyle changes and possibly by taking medicines. Your health care provider will help you make a plan  to bring your blood pressure within a normal range. Eating and drinking   Eat a diet that is high in fiber and potassium, and low in salt (sodium), added sugar, and fat. An example eating plan is called the DASH (Dietary Approaches to Stop Hypertension) diet. To eat this way: ? Eat plenty of fresh fruits and vegetables. Try to fill half of your plate at each meal with fruits and vegetables. ? Eat whole grains, such as whole wheat pasta, [place] rice, or whole grain bread. Fill about one quarter of your plate with whole grains. ? Eat low-fat diary products. ? Avoid fatty cuts of meat, processed or cured meats, and poultry with skin. Fill about one quarter of your plate with lean proteins such as fish, chicken without skin, beans, eggs, and tofu. ? Avoid premade and processed foods. These tend to be higher in sodium, added sugar, and fat.  Reduce your daily sodium intake. Most people with hypertension should eat less than 1,500 mg of sodium a day.  Limit alcohol intake to no more than 1 drink a day for nonpregnant women and 2 drinks a day for men. One drink equals 12 oz of beer, 5 oz of wine, or 1 oz of hard liquor. Lifestyle  Work with your health care provider to maintain a healthy   body weight, or to lose weight. Ask what an ideal weight is for you.  Get at least 30 minutes of exercise that causes your heart to beat faster (aerobic exercise) most days of the week. Activities may include walking, swimming, or biking.  Include exercise to strengthen your muscles (resistance exercise), such as weight lifting, as part of your weekly exercise routine. Try to do these types of exercises for 30 minutes at least 3 days a week.  Do not use any products that contain nicotine or tobacco, such as cigarettes and e-cigarettes. If you need help quitting, ask your health care provider.  Control any long-term (chronic) conditions you have, such as high cholesterol or diabetes. Monitoring  Monitor your blood  pressure at home as told by your health care provider. Your personal target blood pressure may vary depending on your medical conditions, your age, and other factors.  Have your blood pressure checked regularly, as often as told by your health care provider. Working with your health care provider  Review all the medicines you take with your health care provider because there may be side effects or interactions.  Talk with your health care provider about your diet, exercise habits, and other lifestyle factors that may be contributing to hypertension.  Visit your health care provider regularly. Your health care provider can help you create and adjust your plan for managing hypertension. Will I need medicine to control my blood pressure? Your health care provider may prescribe medicine if lifestyle changes are not enough to get your blood pressure under control, and if:  Your systolic blood pressure is 130 or higher.  Your diastolic blood pressure is 80 or higher. Take medicines only as told by your health care provider. Follow the directions carefully. Blood pressure medicines must be taken as prescribed. The medicine does not work as well when you skip doses. Skipping doses also puts you at risk for problems. Contact a health care provider if:  You think you are having a reaction to medicines you have taken.  You have repeated (recurrent) headaches.  You feel dizzy.  You have swelling in your ankles.  You have trouble with your vision. Get help right away if:  You develop a severe headache or confusion.  You have unusual weakness or numbness, or you feel faint.  You have severe pain in your chest or abdomen.  You vomit repeatedly.  You have trouble breathing. Summary  Hypertension is when the force of blood pumping through your arteries is too strong. If this condition is not controlled, it may put you at risk for serious complications.  Your personal target blood pressure  may vary depending on your medical conditions, your age, and other factors. For most people, a normal blood pressure is less than 120/80.  Hypertension is managed by lifestyle changes, medicines, or both. Lifestyle changes include weight loss, eating a healthy, low-sodium diet, exercising more, and limiting alcohol. This information is not intended to replace advice given to you by your health care provider. Make sure you discuss any questions you have with your health care provider. Document Revised: 11/20/2018 Document Reviewed: 06/26/2016 Elsevier Patient Education  [place]. Tension Headache, Adult A tension headache is a feeling of pain, pressure, or aching in the head that is often felt over the front and sides of the head. The pain can be dull, or it can feel tight (constricting). There are two types of tension headache:  Episodic tension headache. This is when the headaches happen  fewer than 15 days a month.  Chronic tension headache. This is when the headaches happen more than 15 days a month during a 19-month period. A tension headache can last from 30 minutes to several days. It is the most common kind of headache. Tension headaches are not normally associated with nausea or vomiting, and they do not get worse with physical activity. What are the causes? The exact cause of this condition is not known. Tension headaches are often triggered by stress, anxiety, or depression. Other triggers include:  Alcohol.  Too much caffeine or caffeine withdrawal.  Respiratory infections, such as colds, flu, or sinus infections.  Dental problems or teeth clenching.  Tiredness (fatigue).  Holding your head and neck in the same position for a long period of time, such as while using a computer.  Smoking.  Arthritis of the neck. What are the signs or symptoms? Symptoms of this condition include:  A feeling of pressure or tightness around the head.  Dull, aching head pain.  Pain  over the front and sides of the head.  Tenderness in the muscles of the head, neck, and shoulders. How is this diagnosed? This condition may be diagnosed based on your symptoms, your medical history, and a physical exam. If your symptoms are severe or unusual, you may have imaging tests, such as a CT scan or an MRI of your head. Your vision may also be checked. How is this treated? This condition may be treated with lifestyle changes and with medicines that help relieve symptoms. Follow these instructions at home: Managing pain  Take over-the-counter and prescription medicines only as told by your health care provider.  When you have a headache, lie down in a dark, quiet room.  If directed, apply ice to the head and neck: ? Put ice in a plastic bag. ? Place a towel between your skin and the bag. ? Leave the ice on for 20 minutes, 2-3 times a day.  If directed, apply heat to the back of your neck as often as told by your health care provider. Use the heat source that your health care provider recommends, such as a moist heat pack or a heating pad. ? Place a towel between your skin and the heat source. ? Leave the heat on for 20-30 minutes. ? Remove the heat if your skin turns bright red. This is especially important if you are unable to feel pain, heat, or cold. You may have a greater risk of getting burned. Eating and drinking  Eat meals on a regular schedule.  Limit alcohol intake to no more than 1 drink a day for nonpregnant women and 2 drinks a day for men. One drink equals 12 oz of beer, 5 oz of wine, or 1 oz of hard liquor.  Drink enough fluid to keep your urine pale yellow.  Decrease your caffeine intake, or stop using caffeine. Lifestyle  Get 7-9 hours of sleep each night, or get the amount of sleep recommended by your health care provider.  At bedtime, remove all electronic devices from your room. Electronic devices include computers, phones, and tablets.  Find ways to  manage your stress. Some things that can help relieve stress include: ? Exercise. ? Deep breathing exercises. ? Yoga. ? Listening to music. ? Positive mental imagery.  Try to sit up straight and avoid tensing your muscles.  Do not use any products that contain nicotine or tobacco, such as cigarettes and e-cigarettes. If you need help quitting, ask your  health care provider. General instructions   Keep all follow-up visits as told by your health care provider. This is important.  Avoid any headache triggers. Keep a headache journal to help find out what may trigger your headaches. For example, write down: ? What you eat and drink. ? How much sleep you get. ? Any change to your diet or medicines. Contact a health care provider if:  Your headache does not get better.  Your headache comes back.  You are sensitive to sounds, light, or smells because of a headache.  You have nausea or you vomit.  Your stomach hurts. Get help right away if:  You suddenly develop a very severe headache along with any of the following: ? A stiff neck. ? Nausea and vomiting. ? Confusion. ? Weakness. ? Double vision or loss of vision. ? Shortness of breath. ? Rash. ? Unusual sleepiness. ? Fever. ? Trouble speaking. ? Pain in your eyes or ears. ? Trouble walking or balancing. ? Feeling faint or passing out. Summary  A tension headache is a feeling of pain, pressure, or aching in the head that is often felt over the front and sides of the head.  A tension headache can last from 30 minutes to several days. It is the most common kind of headache.  This condition may be diagnosed based on your symptoms, your medical history, and a physical exam.  This condition may be treated with lifestyle changes and with medicines that help relieve symptoms. This information is not intended to replace advice given to you by your health care provider. Make sure you discuss any questions you have with your  health care provider. Document Revised: 05/26/2019 Document Reviewed: 11/08/2016 Elsevier Patient Education  2020 [employer].

## 2019-10-13 NOTE — Progress Notes (Signed)
Subjective:    Patient ID: Francisco Harvey, male    DOB: [birth year], 54 y.o.   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  Patient gave me log today 2/19 138/98  At 0815; 130/82 2/21 at 0930; 142/86 @1530  2/22; 138/88 on 2/23 at 1700; 2/24 115 132/90; 2/25 @1300  136/80; 2/26 @1900  132/84; 2/27@1200  128/76; 3/1 @1745  120/70 and 3/3 134/87 clinic appt 0800.  Patient denied chest pain, visual changes, dizziness, dyspnea, sob, chest pain.  Stated headaches unchanged "tension related" if wearing air pack for prolonged periods or driving for prolonged periods.  Requesting refill on Sumatriptan 100mg  1/4 to 1/2 tab and repeat in 2 hours prn working well for him but no refills left on his Rx.  10 tabs lasts 1-2 months depending on work/how frequent wearing air packs.  Gets massage once a month.  Stretches.  Denied visual changes or worst headache of life, shortness of breath/dyspnea or chest pain.       Review of Systems  Constitutional: Negative for activity change, appetite change, chills, diaphoresis, fatigue and fever.  HENT: Negative for trouble swallowing and voice change.   Eyes: Negative for photophobia and visual disturbance.  Respiratory: Negative for cough, shortness of breath, wheezing and stridor.   Cardiovascular: Negative for chest pain, palpitations and leg swelling.  Gastrointestinal: Negative for diarrhea, nausea and vomiting.  Endocrine: Negative for cold intolerance and heat intolerance.  Genitourinary: Negative for difficulty urinating.  Musculoskeletal: Negative for joint swelling.  Skin: Negative for rash.  Allergic/Immunologic: Negative for food allergies.  Neurological: Positive for headaches. Negative for dizziness, tremors, seizures, syncope, facial asymmetry, speech difficulty, weakness,  light-headedness and numbness.  Hematological: Negative for adenopathy. Does not bruise/bleed easily.  Psychiatric/Behavioral: Negative for agitation, confusion and sleep disturbance.       Objective:   Physical Exam Vitals and nursing note reviewed.  Constitutional:      General: He is awake. He is not in acute distress.    Appearance: Normal appearance. He is well-developed and well-groomed. He is obese. He is not ill-appearing, toxic-appearing or diaphoretic.  HENT:     Head: Normocephalic and atraumatic.     Jaw: There is normal jaw occlusion. No trismus.     Right Ear: Hearing, ear canal and external ear normal. A middle ear effusion is present. There is no impacted cerumen.     Left Ear: Hearing, ear canal and external ear normal. A middle ear effusion is present. There is no impacted cerumen.     Nose: Nose normal. No nasal deformity, septal deviation, laceration, congestion or rhinorrhea.     Mouth/Throat:     Mouth: Mucous membranes are not pale, not dry and not cyanotic. No angioedema.     Pharynx: Oropharynx is clear.     Comments: Air fluid level clear bilateral TMS; scar right TM noted Eyes:     General: Lids are normal. Vision grossly intact. Gaze aligned appropriately. Allergic shiner present. No visual field deficit or scleral icterus.       Right eye: No foreign body, discharge or hordeolum.        Left eye: No foreign body, discharge or hordeolum.     Extraocular Movements: Extraocular movements intact.     Right eye: Normal extraocular motion and no nystagmus.     Left eye:  Normal extraocular motion and no nystagmus.     Conjunctiva/sclera: Conjunctivae normal.     Right eye: Right conjunctiva is not injected. No chemosis, exudate or hemorrhage.    Left eye: Left conjunctiva is not injected. No chemosis, exudate or hemorrhage.    Pupils: Pupils are equal, round, and reactive to light. Pupils are equal.     Right eye: Pupil is round and reactive.     Left eye:  Pupil is round and reactive.  Neck:     Thyroid: No thyromegaly.     Trachea: Trachea and phonation normal. No tracheal tenderness or tracheal deviation.  Cardiovascular:     Rate and Rhythm: Normal rate and regular rhythm.     Chest Wall: PMI is not displaced.     Pulses: Normal pulses.          Radial pulses are 2+ on the right side and 2+ on the left side.     Heart sounds: Normal heart sounds, S1 normal and S2 normal. No murmur. No friction rub. No gallop.   Pulmonary:     Effort: Pulmonary effort is normal. No respiratory distress.     Breath sounds: Normal breath sounds and air entry. No stridor, decreased air movement or transmitted upper airway sounds. No decreased breath sounds, wheezing, rhonchi or rales.     Comments: Wearing cloth backlava due to covid 19 pandemic; spoke full sentences without difficulty; no cough observed in exam room Abdominal:     General: Abdomen is flat. There is no distension.     Palpations: Abdomen is soft.  Musculoskeletal:        General: No tenderness. Normal range of motion.     Right shoulder: Normal.     Left shoulder: Normal.     Right elbow: Normal.     Left elbow: Normal.     Right hand: Normal.     Left hand: Normal.     Cervical back: Normal, normal range of motion and neck supple. No swelling, edema, deformity, erythema, signs of trauma, lacerations, rigidity, torticollis or crepitus. Normal range of motion.     Thoracic back: Normal.     Lumbar back: Normal.     Right hip: Normal.     Left hip: Normal.     Right knee: Normal.     Left knee: Normal.     Right lower leg: No edema.     Left lower leg: No edema.  Lymphadenopathy:     Head:     Right side of head: No submental, submandibular or preauricular adenopathy.     Left side of head: No submental, submandibular or preauricular adenopathy.     Cervical: No cervical adenopathy.     Right cervical: No superficial cervical adenopathy.    Left cervical: No superficial cervical  adenopathy.  Skin:    General: Skin is warm and dry.     Capillary Refill: Capillary refill takes less than 2 seconds.     Coloration: Skin is not ashen, cyanotic, jaundiced, mottled, pale or sallow.     Findings: No abrasion, abscess, acne, bruising, burn, ecchymosis, erythema, signs of injury, laceration, lesion, petechiae, rash or wound.     Nails: There is no clubbing.  Neurological:     General: No focal deficit present.     Mental Status: He is alert and oriented to person, place, and time. Mental status is at baseline.     GCS: GCS eye subscore is 4. GCS verbal subscore is 5.  GCS motor subscore is 6.     Cranial Nerves: Cranial nerves are intact. No cranial nerve deficit, dysarthria or facial asymmetry.     Sensory: Sensation is intact. No sensory deficit.     Motor: Motor function is intact. No weakness, tremor, atrophy, abnormal muscle tone or seizure activity.     Coordination: Coordination is intact. Coordination normal.     Gait: Gait is intact. Gait normal.     Comments: Gait sure and steady in clinic; on/off exam table and in/out of chair without difficulty; bilateral hand grasp equal 5/5  Psychiatric:        Attention and Perception: Attention and perception normal.        Mood and Affect: Mood and affect normal.        Speech: Speech normal.        Behavior: Behavior normal. Behavior is cooperative.        Thought Content: Thought content normal.        Cognition and Memory: Cognition and memory normal.        Judgment: Judgment normal.           Assessment & Plan:  A-essential hypertension, BMI 30, headaches recurrent  P-Continue current medications as directed. Refilled atenolol 50mg  po qam and 25mg  po qpm #135 RF2 Continue to monitor blood  pressure at home and maintain log of blood pressure and pulse to bring to follow up appointments. Continue low sodium/DASH diet and exercise program 150 minutes exercise/activity per week.  Recommended weight loss/weight  maintenance to BMI 20-25. Return to the clinic if any new symptoms/notify clinic staff if visual changes, frequent headache, chest pain or dyspnea on mild or  minimal exertion. Exitcare handout on managing hypertension.  Annual physical due Oct.  Discussed with patient if BP staying in 130s/80s increase pm dose to 50mg  and notify clinic/send log for provider review.  Hopefully with increased activity spring/summer weight loss and can decrease back to 50mg  po qam again.  Goal BP 110-120/60-70s  Patient verbalized agreement and understanding of treatment plan and had no further questions at this time.  Tension type headaches explained by patient but sumatriptan works the best for him along with massage/stretching.  Typically taking 25-50mg  per dose cutting pills since Dr prescribed many years ago.  PA Lee filled for him last month but no refills  Electronic Rx to his pharmacy of choice sumatriptan 100mg  take 1 onset of headache and may repeat once in 2 hours.  Max dose 200mg  per 24 hours. #10 RF7.  Patient to follow up if new or worsening symptoms/notify clinic staff. Seek same day evaluation if worst headache of his life, visual changes, chest pain, difficulty breathing, dysphagia/dysphasia or arm/leg weakness.   Continue massage and stretching.  Patient verbalized understanding information/instructions, agreed with plan of care and had no further questions at this time. P2: Diet and Exercise specific for HTN

## 2019-10-25 DIAGNOSIS — M9904 Segmental and somatic dysfunction of sacral region: Secondary | ICD-10-CM | POA: Diagnosis not present

## 2019-10-25 DIAGNOSIS — M9903 Segmental and somatic dysfunction of lumbar region: Secondary | ICD-10-CM | POA: Diagnosis not present

## 2019-10-25 DIAGNOSIS — M5387 Other specified dorsopathies, lumbosacral region: Secondary | ICD-10-CM | POA: Diagnosis not present

## 2019-10-25 DIAGNOSIS — M9902 Segmental and somatic dysfunction of thoracic region: Secondary | ICD-10-CM | POA: Diagnosis not present

## 2019-12-01 DIAGNOSIS — Z79899 Other long term (current) drug therapy: Secondary | ICD-10-CM | POA: Diagnosis not present

## 2019-12-01 DIAGNOSIS — E291 Testicular hypofunction: Secondary | ICD-10-CM | POA: Diagnosis not present

## 2019-12-01 DIAGNOSIS — N401 Enlarged prostate with lower urinary tract symptoms: Secondary | ICD-10-CM | POA: Diagnosis not present

## 2019-12-08 DIAGNOSIS — Z79899 Other long term (current) drug therapy: Secondary | ICD-10-CM | POA: Diagnosis not present

## 2019-12-08 DIAGNOSIS — E6609 Other obesity due to excess calories: Secondary | ICD-10-CM | POA: Diagnosis not present

## 2019-12-08 DIAGNOSIS — N5201 Erectile dysfunction due to arterial insufficiency: Secondary | ICD-10-CM | POA: Diagnosis not present

## 2020-02-05 IMAGING — CR DG LUMBAR SPINE 2-3V
3 series · 3 of 3 positions shown · non-contrast
Comparison: AP view of the abdomen of August 03, 2009

CLINICAL DATA: Severe lower back pain after bending and reaching
for something approximately 1 hour prior to presentation. History of
intermittent back pain following heavy lifting many years ago. No
history of surgery.

EXAM:
LUMBAR SPINE - 2-3 VIEW

[l-spine ap]
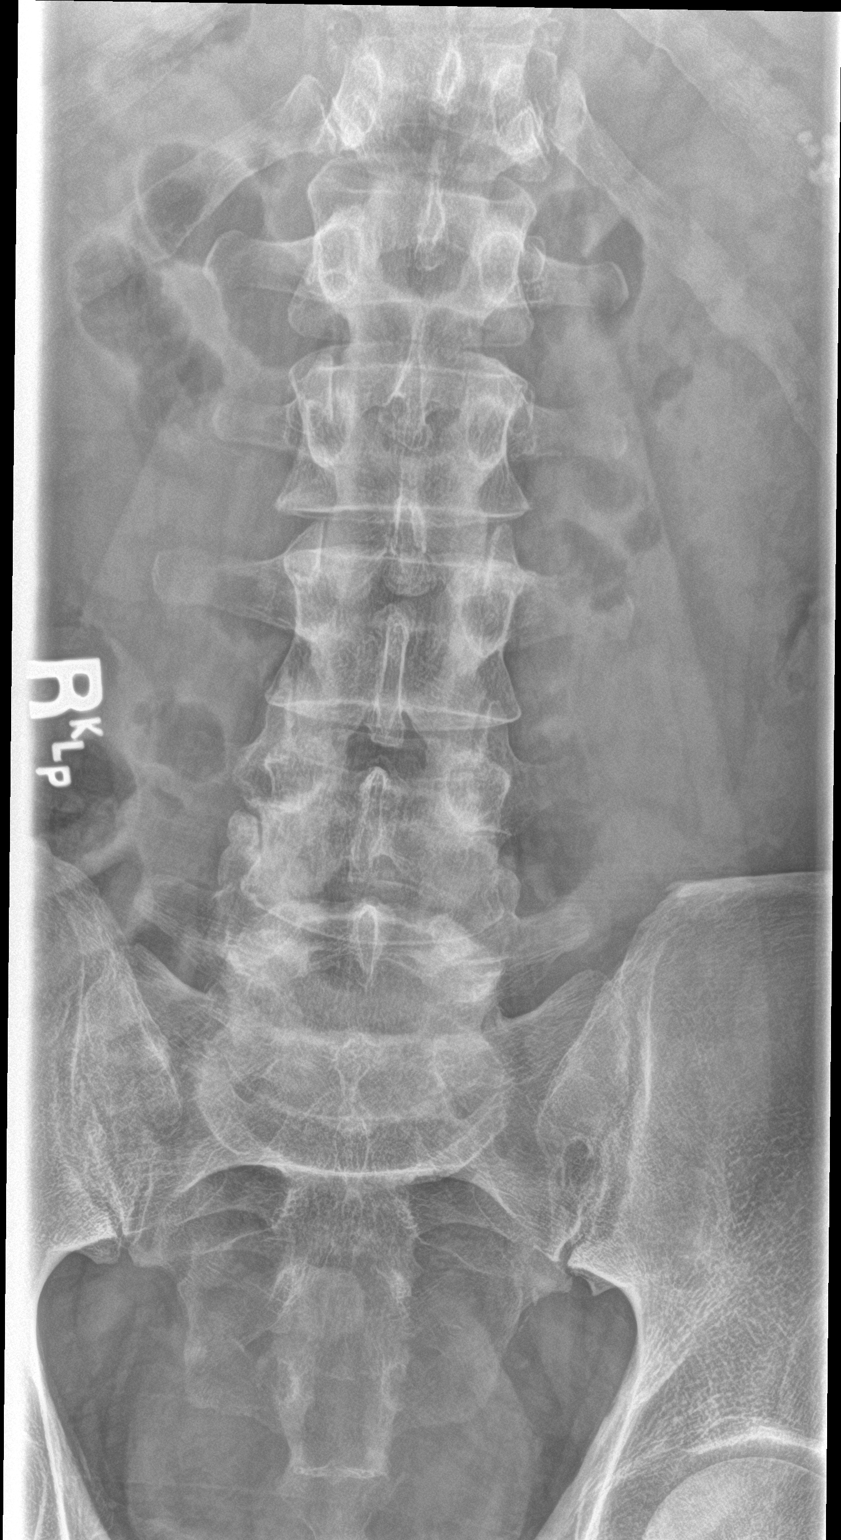

[l-spine lat]
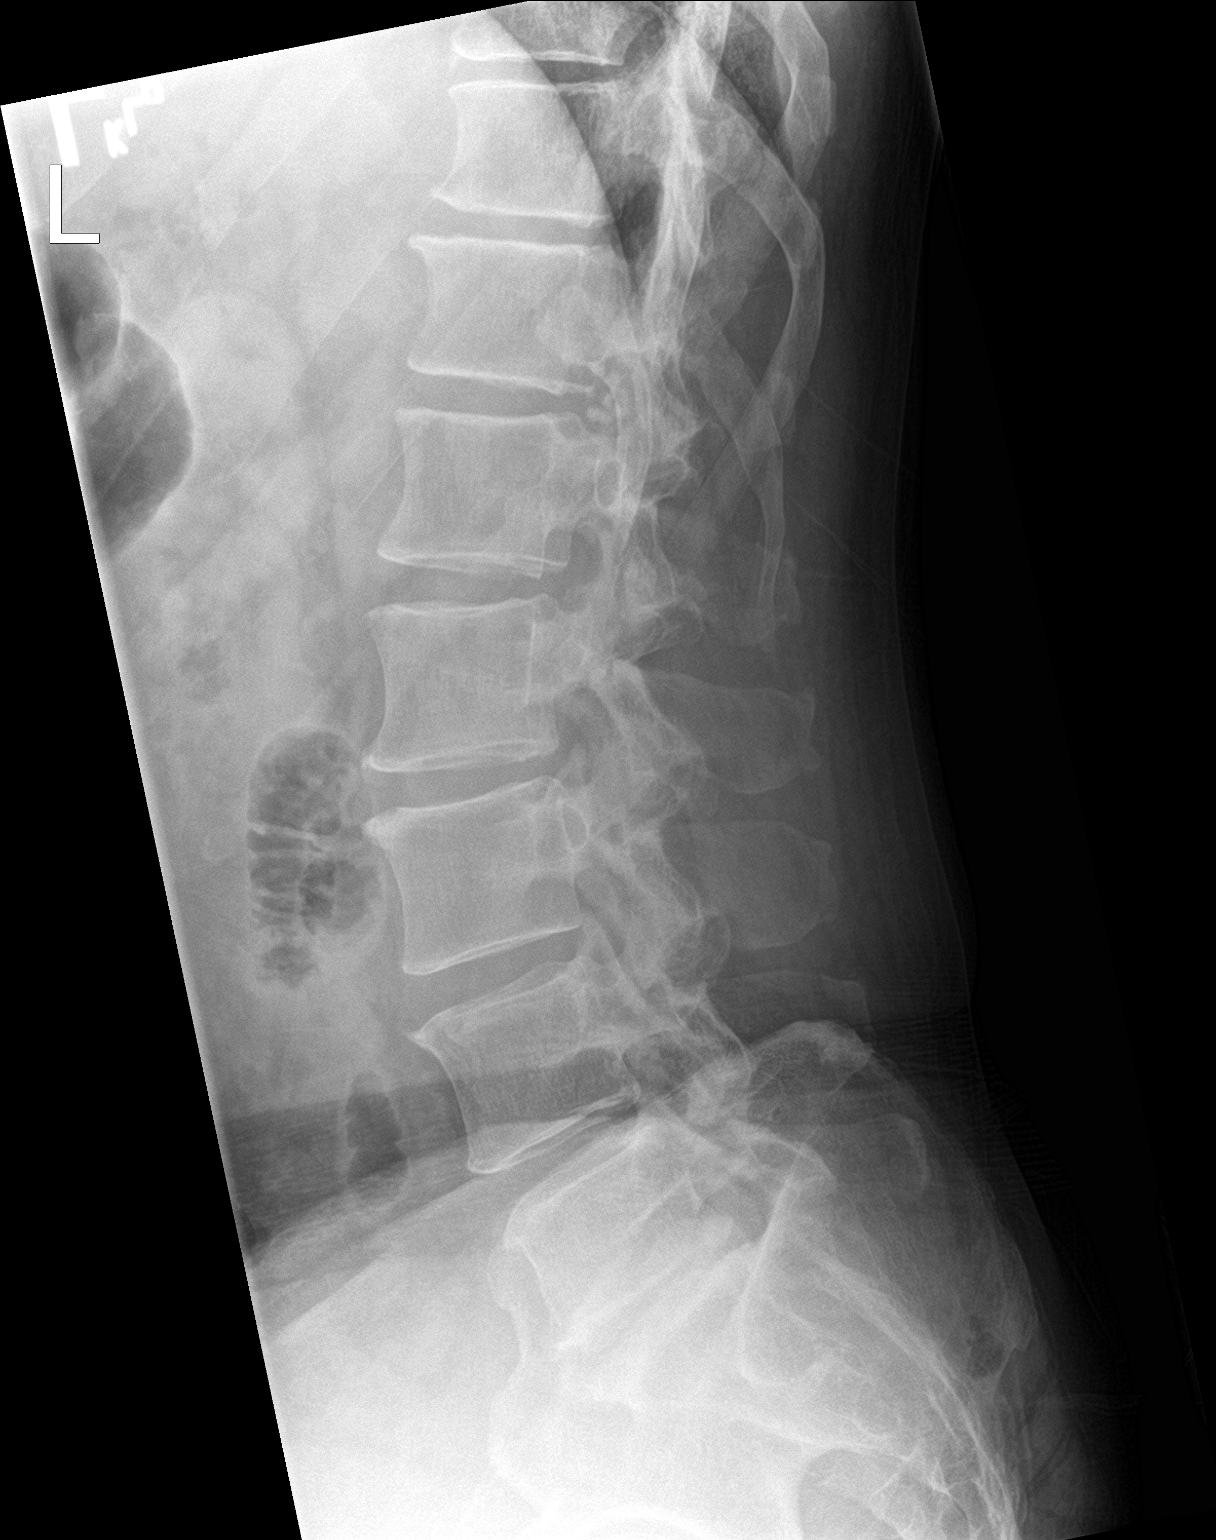

[l-spine spot]
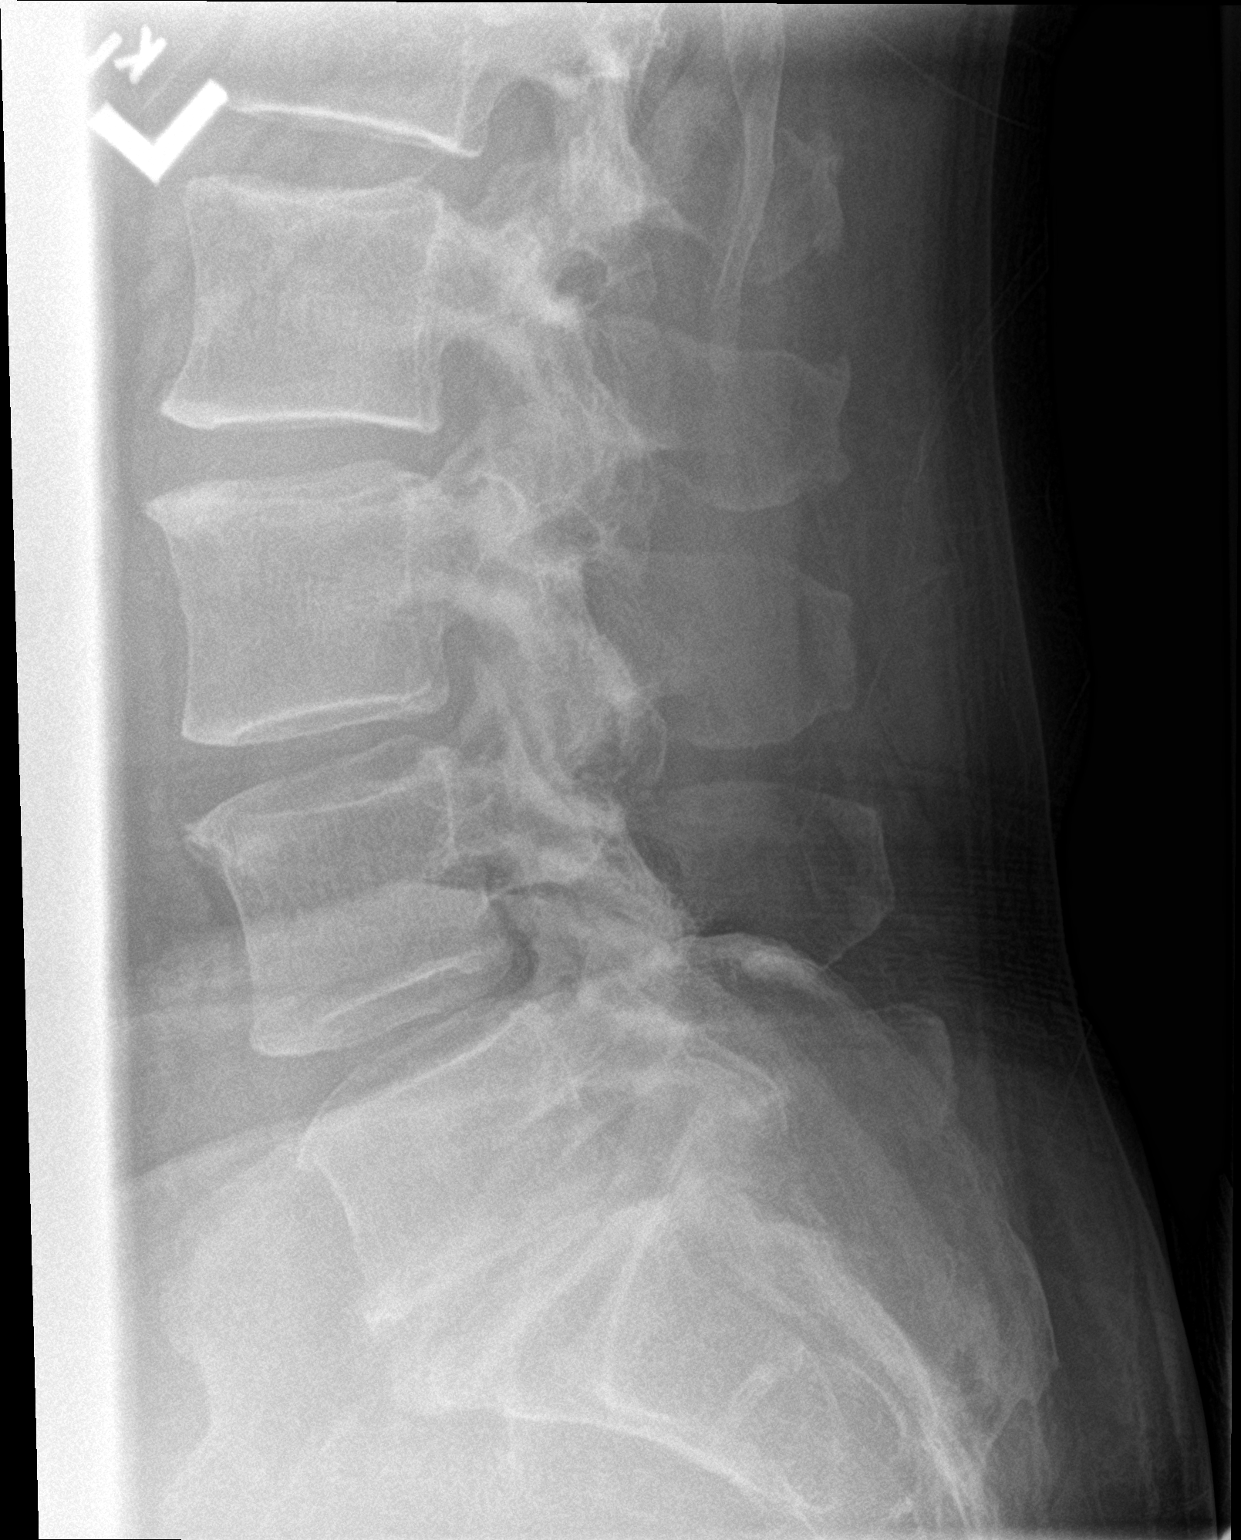

[3 of 3 positions shown; findings below may reference images not displayed]

FINDINGS: The lumbar vertebral bodies are preserved in height. The pedicles
and transverse processes are intact. There is mild disc space
narrowing at L4-5 and L5-S1. There is no spondylolisthesis. There is
no significant facet joint hypertrophy.
IMPRESSION: There is mild degenerative disc space narrowing at L4-5 and L5-S1.
No compression fracture, spondylolisthesis, nor other acute
abnormality.

## 2020-03-07 ENCOUNTER — Other Ambulatory Visit: Payer: Self-pay | Admitting: Physician Assistant

## 2020-03-10 ENCOUNTER — Other Ambulatory Visit: Payer: Self-pay

## 2020-03-10 DIAGNOSIS — E785 Hyperlipidemia, unspecified: Secondary | ICD-10-CM

## 2020-03-11 MED ORDER — ATORVASTATIN CALCIUM 10 MG PO TABS: 10.0000 mg | ORAL_TABLET | Freq: Every day | ORAL | 2 refills | Status: DC

## 2020-04-19 DIAGNOSIS — Z20828 Contact with and (suspected) exposure to other viral communicable diseases: Secondary | ICD-10-CM | POA: Diagnosis not present

## 2020-05-03 ENCOUNTER — Other Ambulatory Visit: Payer: Self-pay

## 2020-05-03 DIAGNOSIS — Z1152 Encounter for screening for COVID-19: Secondary | ICD-10-CM

## 2020-05-03 NOTE — Progress Notes (Signed)
Covid testing - V - exposure 04/27/20

## 2020-05-05 LAB — SARS-COV-2, NAA 2 DAY TAT

## 2020-05-05 LAB — NOVEL CORONAVIRUS, NAA: SARS-CoV-2, NAA: NOT DETECTED

## 2020-05-30 ENCOUNTER — Other Ambulatory Visit: Payer: Self-pay

## 2020-05-30 ENCOUNTER — Ambulatory Visit: Payer: Self-pay

## 2020-05-30 DIAGNOSIS — Z0283 Encounter for blood-alcohol and blood-drug test: Secondary | ICD-10-CM

## 2020-05-30 NOTE — Progress Notes (Signed)
Presents to [facility] for Random Drug Screen & Alcohol Screen. [facility] Acct # [id number] [facility] Specimen # [id number]  AMD

## 2020-06-14 ENCOUNTER — Other Ambulatory Visit: Payer: Self-pay

## 2020-06-14 ENCOUNTER — Ambulatory Visit: Payer: Self-pay

## 2020-06-14 DIAGNOSIS — Z23 Encounter for immunization: Secondary | ICD-10-CM

## 2020-07-19 ENCOUNTER — Ambulatory Visit: Payer: Self-pay

## 2020-07-19 ENCOUNTER — Other Ambulatory Visit: Payer: Self-pay

## 2020-07-19 DIAGNOSIS — Z01818 Encounter for other preprocedural examination: Secondary | ICD-10-CM

## 2020-07-19 LAB — POCT URINALYSIS DIPSTICK
Bilirubin, UA: NEGATIVE
Blood, UA: NEGATIVE
Glucose, UA: NEGATIVE
Ketones, UA: NEGATIVE
Leukocytes, UA: NEGATIVE
Nitrite, UA: NEGATIVE
Protein, UA: NEGATIVE
Spec Grav, UA: 1.01 (ref 1.010–1.025)
Urobilinogen, UA: 0.2 E.U./dL
pH, UA: 7 (ref 5.0–8.0)

## 2020-07-19 NOTE — Progress Notes (Signed)
Pt scheduled to complete physical with Kathie Dike

## 2020-07-22 LAB — CMP12+LP+TP+TSH+6AC+PSA+CBC…
ALT: 28 IU/L (ref 0–44)
AST: 20 IU/L (ref 0–40)
Albumin/Globulin Ratio: 1.9 (ref 1.2–2.2)
Albumin: 4.7 g/dL (ref 3.8–4.9)
Alkaline Phosphatase: 97 IU/L (ref 44–121)
BUN/Creatinine Ratio: 12 (ref 9–20)
BUN: 11 mg/dL (ref 6–24)
Basophils Absolute: 0 10*3/uL (ref 0.0–0.2)
Basos: 0 %
Bilirubin Total: 0.5 mg/dL (ref 0.0–1.2)
Calcium: 9.3 mg/dL (ref 8.7–10.2)
Chloride: 104 mmol/L (ref 96–106)
Chol/HDL Ratio: 4.1 ratio (ref 0.0–5.0)
Cholesterol, Total: 183 mg/dL (ref 100–199)
Creatinine, Ser: 0.94 mg/dL (ref 0.76–1.27)
EOS (ABSOLUTE): 0.1 10*3/uL (ref 0.0–0.4)
Eos: 2 %
Estimated CHD Risk: 0.8 times avg. (ref 0.0–1.0)
Free Thyroxine Index: 1.3 (ref 1.2–4.9)
GFR calc Af Amer: 105 mL/min/{1.73_m2} (ref >59)
GFR calc non Af Amer: 91 mL/min/{1.73_m2} (ref >59)
GGT: 28 IU/L (ref 0–65)
Globulin, Total: 2.5 g/dL (ref 1.5–4.5)
Glucose: 132 mg/dL — ABNORMAL HIGH (ref 65–99)
HDL: 45 mg/dL (ref >39)
Hematocrit: 45.5 % (ref 37.5–51.0)
Hemoglobin: 16.1 g/dL (ref 13.0–17.7)
Immature Grans (Abs): 0 10*3/uL (ref 0.0–0.1)
Immature Granulocytes: 0 %
Iron: 106 ug/dL (ref 38–169)
LDH: 184 IU/L (ref 121–224)
LDL Chol Calc (NIH): 113 mg/dL — ABNORMAL HIGH (ref 0–99)
Lymphocytes Absolute: 1.2 10*3/uL (ref 0.7–3.1)
Lymphs: 31 %
MCH: 32.6 pg (ref 26.6–33.0)
MCHC: 35.4 g/dL (ref 31.5–35.7)
MCV: 92 fL (ref 79–97)
Monocytes Absolute: 0.4 10*3/uL (ref 0.1–0.9)
Monocytes: 9 %
Neutrophils Absolute: 2.3 10*3/uL (ref 1.4–7.0)
Neutrophils: 58 %
Phosphorus: 2.5 mg/dL — ABNORMAL LOW (ref 2.8–4.1)
Platelets: 244 10*3/uL (ref 150–450)
Potassium: 4.8 mmol/L (ref 3.5–5.2)
Prostate Specific Ag, Serum: 1 ng/mL (ref 0.0–4.0)
RBC: 4.94 x10E6/uL (ref 4.14–5.80)
RDW: 12.3 % (ref 11.6–15.4)
Sodium: 142 mmol/L (ref 134–144)
T3 Uptake Ratio: 28 % (ref 24–39)
T4, Total: 4.5 ug/dL (ref 4.5–12.0)
TSH: 1.38 u[IU]/mL (ref 0.450–4.500)
Total Protein: 7.2 g/dL (ref 6.0–8.5)
Triglycerides: 139 mg/dL (ref 0–149)
Uric Acid: 5.4 mg/dL (ref 3.8–8.4)
VLDL Cholesterol Cal: 25 mg/dL (ref 5–40)
WBC: 4 10*3/uL (ref 3.4–10.8)

## 2020-07-22 LAB — QUANTIFERON-TB GOLD PLUS
QuantiFERON Mitogen Value: 9.14 IU/mL
QuantiFERON Nil Value: 0 IU/mL
QuantiFERON TB1 Ag Value: 0 IU/mL
QuantiFERON TB2 Ag Value: 0 IU/mL
QuantiFERON-TB Gold Plus: NEGATIVE

## 2020-07-25 ENCOUNTER — Encounter: Payer: Self-pay | Admitting: Nurse Practitioner

## 2020-07-31 ENCOUNTER — Encounter: Payer: Self-pay | Admitting: Physician Assistant

## 2020-07-31 ENCOUNTER — Other Ambulatory Visit: Payer: Self-pay

## 2020-07-31 ENCOUNTER — Ambulatory Visit: Payer: Self-pay

## 2020-07-31 VITALS — BP 120/76 | HR 53 | Temp 97.5°F | Resp 14 | Ht 70.0 in | Wt 212.0 lb

## 2020-07-31 DIAGNOSIS — Z0000 Encounter for general adult medical examination without abnormal findings: Secondary | ICD-10-CM

## 2020-08-18 ENCOUNTER — Encounter: Payer: Self-pay | Admitting: Physician Assistant

## 2020-08-18 ENCOUNTER — Other Ambulatory Visit: Payer: Self-pay

## 2020-08-18 ENCOUNTER — Ambulatory Visit: Payer: Self-pay | Admitting: Physician Assistant

## 2020-08-18 VITALS — BP 122/84 | HR 54 | Temp 97.7°F | Ht 70.0 in | Wt 210.0 lb

## 2020-08-18 DIAGNOSIS — R7309 Other abnormal glucose: Secondary | ICD-10-CM

## 2020-08-18 LAB — POCT GLYCOSYLATED HEMOGLOBIN (HGB A1C): Hemoglobin A1C: 5.2 % (ref 4.0–5.6)

## 2020-08-18 NOTE — Progress Notes (Signed)
   Subjective: Annual firefighter exam    Patient ID: Francisco Harvey, male    DOB: [birth year], 55 y.o.   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  Patient voiced no concerns or complaints.   Review of Systems Hyperlipidemia and hypertension    Objective:   Physical Exam No acute distress.  Patient BP is 122/84, pulse is 54, respirations 12, O2 sat is 95%. HEENT is unremarkable except for scar tissue of the left TM.  Neck is supple for adenopathy or bruits.  Lungs clear to auscultation.  Patient is asymptomatic bradycardic at 54 bpm with a regular rhythm.  Abdomen with negative HSM, normoactive bowel sounds, soft, nontender to palpation.  No obvious deformity to the upper or lower extremities.  Patient has full equal range of motion of the upper and lower extremities.  No obvious deformity to the cervical lumbar spine.  Patient has full and equal range of motion cervical lumbar spine.  Cranial nerves II through XII grossly intact.  Patient has bilateral fingernail deformity consistent with fungal infection.       Assessment & Plan: Well exam.  Discussed patient bradycardic.  He is taking atenolol at 50 mg in the morning 25 mg at night.  Patient denies vertigo.  Reviewed EKG which shows sinus bradycardia 51 bpm.  Discussed medication change of his atenolol.  Advised to discontinue the nighttime dosage.  After 3 days will check his blood pressure in the clinic.  Patient deferred treatment for onychomycosis at this time.  Patient fasting labs showed  glucose of 132.  In clinic hemoglobin A1c was 5.2.

## 2020-08-22 ENCOUNTER — Other Ambulatory Visit: Payer: Self-pay

## 2020-08-22 DIAGNOSIS — G4489 Other headache syndrome: Secondary | ICD-10-CM

## 2020-08-22 MED ORDER — SUMATRIPTAN SUCCINATE 100 MG PO TABS: ORAL_TABLET | ORAL | 7 refills | Status: DC

## 2020-10-07 ENCOUNTER — Other Ambulatory Visit: Payer: Self-pay | Admitting: Registered Nurse

## 2020-10-07 DIAGNOSIS — I10 Essential (primary) hypertension: Secondary | ICD-10-CM

## 2020-10-19 ENCOUNTER — Other Ambulatory Visit: Payer: Self-pay | Admitting: Physician Assistant

## 2020-10-19 DIAGNOSIS — E785 Hyperlipidemia, unspecified: Secondary | ICD-10-CM

## 2021-01-09 ENCOUNTER — Other Ambulatory Visit: Payer: Self-pay

## 2021-01-11 ENCOUNTER — Other Ambulatory Visit: Payer: Self-pay | Admitting: Physician Assistant

## 2021-01-11 DIAGNOSIS — E785 Hyperlipidemia, unspecified: Secondary | ICD-10-CM

## 2021-04-06 ENCOUNTER — Other Ambulatory Visit: Payer: Self-pay | Admitting: Physician Assistant

## 2021-04-06 DIAGNOSIS — G4489 Other headache syndrome: Secondary | ICD-10-CM

## 2021-04-27 DIAGNOSIS — N5201 Erectile dysfunction due to arterial insufficiency: Secondary | ICD-10-CM | POA: Diagnosis not present

## 2021-04-27 DIAGNOSIS — N401 Enlarged prostate with lower urinary tract symptoms: Secondary | ICD-10-CM | POA: Diagnosis not present

## 2021-04-27 DIAGNOSIS — Z125 Encounter for screening for malignant neoplasm of prostate: Secondary | ICD-10-CM | POA: Diagnosis not present

## 2021-05-01 ENCOUNTER — Other Ambulatory Visit: Payer: Self-pay

## 2021-05-01 NOTE — Telephone Encounter (Signed)
When inputting Rx refill request info for Imitrex 100 mg, a box opened up where a Rx refill request was in Epic - had been sent electronically by patient's pharmacy to [facility] ED.  Re-routed the Rx refill request for Imitrex to Durward Parcel, PA-C.  AMD

## 2021-05-03 ENCOUNTER — Other Ambulatory Visit: Payer: Self-pay

## 2021-05-03 DIAGNOSIS — G4489 Other headache syndrome: Secondary | ICD-10-CM

## 2021-05-04 ENCOUNTER — Other Ambulatory Visit: Payer: Self-pay

## 2021-05-04 DIAGNOSIS — G4489 Other headache syndrome: Secondary | ICD-10-CM

## 2021-05-04 MED ORDER — SUMATRIPTAN SUCCINATE 100 MG PO TABS: ORAL_TABLET | ORAL | 7 refills | Status: DC

## 2021-07-26 ENCOUNTER — Other Ambulatory Visit: Payer: Self-pay

## 2021-07-26 ENCOUNTER — Ambulatory Visit: Payer: Self-pay

## 2021-07-26 DIAGNOSIS — Z0000 Encounter for general adult medical examination without abnormal findings: Secondary | ICD-10-CM

## 2021-07-26 LAB — POCT URINALYSIS DIPSTICK
Bilirubin, UA: NEGATIVE
Blood, UA: NEGATIVE
Glucose, UA: NEGATIVE
Ketones, UA: NEGATIVE
Leukocytes, UA: NEGATIVE
Nitrite, UA: NEGATIVE
Protein, UA: NEGATIVE
Spec Grav, UA: 1.01 (ref 1.010–1.025)
Urobilinogen, UA: 0.2 E.U./dL
pH, UA: 7 (ref 5.0–8.0)

## 2021-07-26 NOTE — Progress Notes (Signed)
Reviewed CDC recommendations for importance of HIV/ Hep C screening once in lifetime. Patient has declined HIV / Hep C screenings today and will let us know if they should change their mind in the future.   Reviewed CDC recommendations for importance of the COVID-19 Vaccine. Pt has declined the vaccine BOOSTER today and for the future. Pt has also denied his influenza vaccine and TDAP.  Pt is also aware he is due for colonoscopy and if concerned he will talk to the provider at scheduled visit. [name]

## 2021-07-26 NOTE — Progress Notes (Signed)
Pt presents today for hearing and vision per annual physical. Performed by Reece Packer.

## 2021-07-27 LAB — CMP12+LP+TP+TSH+6AC+PSA+CBC…
ALT: 27 IU/L (ref 0–44)
AST: 20 IU/L (ref 0–40)
Albumin/Globulin Ratio: 2 (ref 1.2–2.2)
Albumin: 4.5 g/dL (ref 3.8–4.9)
Alkaline Phosphatase: 96 IU/L (ref 44–121)
BUN/Creatinine Ratio: 10 (ref 9–20)
BUN: 10 mg/dL (ref 6–24)
Basophils Absolute: 0 10*3/uL (ref 0.0–0.2)
Basos: 1 %
Bilirubin Total: 0.5 mg/dL (ref 0.0–1.2)
Calcium: 9 mg/dL (ref 8.7–10.2)
Chloride: 102 mmol/L (ref 96–106)
Chol/HDL Ratio: 3.8 ratio (ref 0.0–5.0)
Cholesterol, Total: 169 mg/dL (ref 100–199)
Creatinine, Ser: 0.97 mg/dL (ref 0.76–1.27)
EOS (ABSOLUTE): 0.2 10*3/uL (ref 0.0–0.4)
Eos: 5 %
Estimated CHD Risk: 0.7 times avg. (ref 0.0–1.0)
Free Thyroxine Index: 1.2 (ref 1.2–4.9)
GGT: 26 IU/L (ref 0–65)
Globulin, Total: 2.2 g/dL (ref 1.5–4.5)
Glucose: 113 mg/dL — ABNORMAL HIGH (ref 70–99)
HDL: 45 mg/dL (ref >39)
Hematocrit: 46.9 % (ref 37.5–51.0)
Hemoglobin: 15.8 g/dL (ref 13.0–17.7)
Immature Grans (Abs): 0 10*3/uL (ref 0.0–0.1)
Immature Granulocytes: 0 %
Iron: 95 ug/dL (ref 38–169)
LDH: 171 IU/L (ref 121–224)
LDL Chol Calc (NIH): 107 mg/dL — ABNORMAL HIGH (ref 0–99)
Lymphocytes Absolute: 1.3 10*3/uL (ref 0.7–3.1)
Lymphs: 30 %
MCH: 31.6 pg (ref 26.6–33.0)
MCHC: 33.7 g/dL (ref 31.5–35.7)
MCV: 94 fL (ref 79–97)
Monocytes Absolute: 0.4 10*3/uL (ref 0.1–0.9)
Monocytes: 8 %
Neutrophils Absolute: 2.5 10*3/uL (ref 1.4–7.0)
Neutrophils: 56 %
Phosphorus: 2.7 mg/dL — ABNORMAL LOW (ref 2.8–4.1)
Platelets: 237 10*3/uL (ref 150–450)
Potassium: 4.5 mmol/L (ref 3.5–5.2)
Prostate Specific Ag, Serum: 1 ng/mL (ref 0.0–4.0)
RBC: 5 x10E6/uL (ref 4.14–5.80)
RDW: 12.3 % (ref 11.6–15.4)
Sodium: 139 mmol/L (ref 134–144)
T3 Uptake Ratio: 25 % (ref 24–39)
T4, Total: 4.6 ug/dL (ref 4.5–12.0)
TSH: 1.98 u[IU]/mL (ref 0.450–4.500)
Total Protein: 6.7 g/dL (ref 6.0–8.5)
Triglycerides: 93 mg/dL (ref 0–149)
Uric Acid: 5.5 mg/dL (ref 3.8–8.4)
VLDL Cholesterol Cal: 17 mg/dL (ref 5–40)
WBC: 4.4 10*3/uL (ref 3.4–10.8)
eGFR: 92 mL/min/{1.73_m2} (ref >59)

## 2021-07-30 LAB — SPECIMEN STATUS REPORT

## 2021-07-30 LAB — HGB A1C W/O EAG: Hgb A1c MFr Bld: 6.1 % — ABNORMAL HIGH (ref 4.8–5.6)

## 2021-08-01 ENCOUNTER — Other Ambulatory Visit: Payer: Self-pay

## 2021-08-01 ENCOUNTER — Encounter: Payer: Self-pay | Admitting: Physician Assistant

## 2021-08-01 ENCOUNTER — Ambulatory Visit: Payer: Self-pay | Admitting: Physician Assistant

## 2021-08-01 DIAGNOSIS — G4489 Other headache syndrome: Secondary | ICD-10-CM

## 2021-08-01 MED ORDER — SUMATRIPTAN SUCCINATE 100 MG PO TABS: ORAL_TABLET | ORAL | 7 refills | Status: DC

## 2021-08-01 NOTE — Progress Notes (Signed)
[facility]   ____________________________________________   None    (approximate)  I have reviewed the triage vital signs and the nursing notes.   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  Patient reports no concerns or complaints.  Requests refill of Imitrex for migraine headaches.         Past Medical History:  Diagnosis Date   Diverticulitis    Elevated LDL cholesterol level    Hypertension    Kidney stones    Migraines     Patient Active Problem List   Diagnosis Date Noted   Essential hypertension 09/30/2019   BMI 30.0-30.9,adult 09/30/2019    Past Surgical History:  Procedure Laterality Date   knee surgery     ROTATOR CUFF REPAIR     SHOULDER SURGERY      Prior to Admission medications   Medication Sig Start Date End Date Taking? Authorizing Provider  atenolol (TENORMIN) 50 MG tablet TAKE 1 TABLET (50 MG) IN THE MORNING AND 1/2 TABLET( 25 MG) IN THE EVENING 10/09/20  Yes Summers, Rhonda L, PA-C  atorvastatin (LIPITOR) 10 MG tablet TAKE 1 TABLET BY MOUTH EVERY DAY 01/09/21  Yes Apolonio Schneiders, FNP  SUMAtriptan (IMITREX) 100 MG tablet May repeat in 2 hours if headache persists or recurs. 08/01/21   Sable Feil, PA-C    Allergies Patient has no known allergies.  Family History  Problem Relation Age of Onset   Diabetes Mother    Heart disease Mother    Lung cancer Father        Non-smoker    Social History Social History   Tobacco Use   Smoking status: Never   Smokeless tobacco: Never  Substance Use Topics   Alcohol use: Not Currently   Drug use: Not Currently    Review of Systems Constitutional: No fever/chills Eyes: No visual changes. ENT: No sore throat. Cardiovascular: Denies chest pain. Respiratory: Denies shortness of breath. Gastrointestinal: No abdominal pain.  No nausea, no vomiting.  No diarrhea.  No  constipation. Genitourinary: Negative for dysuria. Musculoskeletal: Negative for back pain. Skin: Negative for rash. Neurological: Migraine headaches Endocrine: Hyperlipidemia hypertension..  ____________________________________________   PHYSICAL EXAM:  VITAL SIGNS: Date and Time Temp Pulse RR BP SpO2 Weight BMI Who  08/01/21 1112 97.6 F (36.4 C) 62 14 132/87 96 % 215 lb (97.5 kg) 31.3 CFL     Constitutional: Alert and oriented. Well appearing and in no acute distress. Eyes: Conjunctivae are normal. PERRL. EOMI. Head: Atraumatic. Nose: No congestion/rhinnorhea. Mouth/Throat: Mucous membranes are moist.  Oropharynx non-erythematous. Neck: No stridor.  No cervical spine tenderness to palpation. Hematological/Lymphatic/Immunilogical: No cervical lymphadenopathy. Cardiovascular: Normal rate, regular rhythm. Grossly normal heart sounds.  Good peripheral circulation. Respiratory: Normal respiratory effort.  No retractions. Lungs CTAB. Gastrointestinal: Soft and nontender. No distention. No abdominal bruits. No CVA tenderness. Genitourinary: Deferred Musculoskeletal: No lower extremity tenderness nor edema.  No joint effusions. Neurologic:  Normal speech and language. No gross focal neurologic deficits are appreciated. No gait instability. Skin:  Skin is warm, dry and intact. No rash noted. Psychiatric: Mood and affect are normal. Speech and behavior are normal.  ____________________________________________   LABS (all labs ordered are listed, but only abnormal results are displayed)  Component Ref Range & Units 6 d ago  (07/26/21) 1 yr ago  (07/19/20) 2 yr ago  (05/27/19) 12 yr ago  (11/17/08) 12 yr  ago  (11/15/08)  Glucose 70 - 99 mg/dL 113 High   132 High  R  113 High  R   94   Uric Acid 3.8 - 8.4 mg/dL 5.5  5.4 CM  5.1 R, CM     Comment:            Therapeutic target for gout patients: <6.0  BUN 6 - 24 mg/dL 10  11  16   10  R   Creatinine, Ser 0.76 - 1.27 mg/dL 0.97  0.94   0.89   0.97 R   eGFR >59 mL/min/1.73 92       BUN/Creatinine Ratio 9 - 20 10  12  18      Sodium 134 - 144 mmol/L 139  142  138   141 R   Potassium 3.5 - 5.2 mmol/L 4.5  4.8  4.6   4.8 R   Chloride 96 - 106 mmol/L 102  104  104   105 R   Calcium 8.7 - 10.2 mg/dL 9.0  9.3  8.6 Low    9.2 R   Phosphorus 2.8 - 4.1 mg/dL 2.7 Low   2.5 Low   3.0     Total Protein 6.0 - 8.5 g/dL 6.7  7.2  6.8     Albumin 3.8 - 4.9 g/dL 4.5  4.7  4.4     Globulin, Total 1.5 - 4.5 g/dL 2.2  2.5  2.4     Albumin/Globulin Ratio 1.2 - 2.2 2.0  1.9  1.8     Bilirubin Total 0.0 - 1.2 mg/dL 0.5  0.5  0.5     Alkaline Phosphatase 44 - 121 IU/L 96  97 CM  87 R     LDH 121 - 224 IU/L 171  184  162     AST 0 - 40 IU/L 20  20  23      ALT 0 - 44 IU/L 27  28  31      GGT 0 - 65 IU/L 26  28  27      Iron 38 - 169 ug/dL 95  106  116     Cholesterol, Total 100 - 199 mg/dL 169  183  171     Triglycerides 0 - 149 mg/dL 93  139  105     HDL >39 mg/dL 45  45  44     VLDL Cholesterol Cal 5 - 40 mg/dL 17  25  19      LDL Chol Calc (NIH) 0 - 99 mg/dL 107 High   113 High   108 High      Chol/HDL Ratio 0.0 - 5.0 ratio 3.8  4.1 CM  3.9 CM     Comment:                                   T. Chol/HDL Ratio                                              Men  Women                                1/2 Avg.Risk  3.4    3.3  Avg.Risk  5.0    4.4                                 2X Avg.Risk  9.6    7.1                                 3X Avg.Risk 23.4   11.0   Estimated CHD Risk 0.0 - 1.0 times avg. 0.7  0.8 CM  0.7 CM     Comment: The CHD Risk is based on the T. Chol/HDL ratio. Other  factors affect CHD Risk such as hypertension, smoking,  diabetes, severe obesity, and family history of  premature CHD.   TSH 0.450 - 4.500 uIU/mL 1.980  1.380  2.300     T4, Total 4.5 - 12.0 ug/dL 4.6  4.5  4.3 Low      T3 Uptake Ratio 24 - 39 % 25  28  27      Free Thyroxine Index 1.2 - 4.9 1.2  1.3  1.2     Prostate Specific Ag,  Serum 0.0 - 4.0 ng/mL 1.0  1.0 CM  0.9 CM     Comment: Roche ECLIA methodology.  According to the American Urological Association, Serum PSA should  decrease and remain at undetectable levels after radical  prostatectomy. The AUA defines biochemical recurrence as an initial  PSA value 0.2 ng/mL or greater followed by a subsequent confirmatory  PSA value 0.2 ng/mL or greater.  Values obtained with different assay methods or kits cannot be used  interchangeably. Results cannot be interpreted as absolute evidence  of the presence or absence of malignant disease.   WBC 3.4 - 10.8 x10E3/uL 4.4  4.0  4.4     RBC 4.14 - 5.80 x10E6/uL 5.00  4.94  4.69     Hemoglobin 13.0 - 17.7 g/dL 15.8  16.1  15.0  15.1 R    Hematocrit 37.5 - 51.0 % 46.9  45.5  43.7     MCV 79 - 97 fL 94  92  93     MCH 26.6 - 33.0 pg 31.6  32.6  32.0     MCHC 31.5 - 35.7 g/dL 33.7  35.4  34.3     RDW 11.6 - 15.4 % 12.3  12.3  12.2     Platelets 150 - 450 x10E3/uL 237  244  205     Neutrophils Not Estab. % 56  58  59     Lymphs Not Estab. % 30  31  30      Monocytes Not Estab. % 8  9  9      Eos Not Estab. % 5  2  2      Basos Not Estab. % 1  0  0     Neutrophils Absolute 1.4 - 7.0 x10E3/uL 2.5  2.3  2.6     Lymphocytes Absolute 0.7 - 3.1 x10E3/uL 1.3  1.2  1.3     Monocytes Absolute 0.1 - 0.9 x10E3/uL 0.4  0.4  0.4     EOS (ABSOLUTE) 0.0 - 0.4 x10E3/uL 0.2  0.1  0.1     Basophils Absolute 0.0 - 0.2 x10E3/uL 0.0  0.0  0.0     Immature Granulocytes Not Estab. % 0  0  0     Immature Grans (Abs) 0.0 - 0.1 x10E3/uL 0.0  0.0  0.0  Resulting Agency  [facility]       Narrative Performed by: Maryan Puls Performed at:  [facility]  [address] [place], [place]  [id number]  Lab Director: Rush Farmer MD, Phone:  [phone]    Specimen Collected: 07/26/21 08:50 Last Resulted: 07/27/21 08:13      Lab Flowsheet    Order Details    View Encounter    Lab and Collection  Details    Routing    Result History    View Encounter Conversation      CM=Additional comments  R=Reference range differs from displayed range      Result Care Coordination   Patient Communication   Add Comments   Seen Back to Top       Other Results from 07/26/2021   POCT urinalysis dipstick Order: [id number] Status: Final result    Visible to patient: Yes (seen)    Next appt: None    Dx: Annual physical exam    0 Result Notes Component Ref Range & Units 6 d ago 1 yr ago 2 yr ago  Color, UA  yellow  yellow  Yellow   Clarity, UA  clear  clear  Clear   Glucose, UA Negative Negative  Negative  Negative   Bilirubin, UA  negative  negative  Negative   Ketones, UA  negative  negative  Negative   Spec Grav, UA 1.010 - 1.025 1.010  1.010  1.020   Blood, UA  negative  negative  Negative   pH, UA 5.0 - 8.0 7.0  7.0  6.0   Protein, UA Negative Negative  Negative  Negative   Urobilinogen, UA 0.2 or 1.0 E.U./dL 0.2  0.2  0.2   Nitrite, UA  negative  negative  Negative   Leukocytes, UA Negative Negative  Negative  Negative   Appearance  light  light     Odor               Specimen Collected: 07/26/21 09:36 Last Resulted: 07/26/21 09:36      Lab Flowsheet    Order Details    View Encounter    Lab and Collection Details    Routing    Result History    View Encounter Conversation        Result Care Coordination   Patient Communication   Add Comments   Seen Back to Top          Contains abnormal data Hgb A1c w/o eAG Order: [id number] Status: Final result    Visible to patient: Yes (seen)    Next appt: None    0 Result Notes Component Ref Range & Units 6 d ago 11 mo ago  Hgb A1c MFr Bld 4.8 - 5.6 % 6.1 High   5.2 R   Comment:          Prediabetes: 5.7 - 6.4           Diabetes: >6.4      ____________________________________________  EKG  Sinus  Bradycardia  WITHIN NORMAL LIMITS ____________________________________________   Official radiology  report(s): No results found.  ____________________________________________    ____________________________________________   INITIAL IMPRESSION / ASSESSMENT AND PLAN  As part of my medical decision making, I reviewed the following data within the [facility] reviewed , EKG interpreted , Old EKG reviewed, Discussed with PCP ,  and [place] Controlled Substance Database  ____________________________________________   FINAL CLINICAL IMPRESSION(S) / ED DIAGNOSES  Well exam.   ED Discharge Orders          Ordered    SUMAtriptan (IMITREX) 100 MG tablet        08/01/21 1142             Note:  This document was prepared using Dragon voice recognition software and may include unintentional dictation errors.

## 2021-08-01 NOTE — Progress Notes (Signed)
Pt denies any issues or concerns at this time/CL,RMA ?

## 2021-11-10 ENCOUNTER — Other Ambulatory Visit: Payer: Self-pay | Admitting: Nurse Practitioner

## 2021-11-10 DIAGNOSIS — E785 Hyperlipidemia, unspecified: Secondary | ICD-10-CM

## 2021-11-15 ENCOUNTER — Other Ambulatory Visit: Payer: Self-pay

## 2021-11-15 DIAGNOSIS — I10 Essential (primary) hypertension: Secondary | ICD-10-CM

## 2021-11-15 MED ORDER — ATENOLOL 50 MG PO TABS: ORAL_TABLET | ORAL | 3 refills | Status: DC

## 2021-11-21 ENCOUNTER — Telehealth: Payer: Self-pay

## 2021-11-26 ENCOUNTER — Other Ambulatory Visit: Payer: Self-pay

## 2021-11-26 DIAGNOSIS — E785 Hyperlipidemia, unspecified: Secondary | ICD-10-CM

## 2021-11-26 MED ORDER — ATORVASTATIN CALCIUM 10 MG PO TABS: 10.0000 mg | ORAL_TABLET | Freq: Every day | ORAL | 3 refills | Status: DC

## 2021-11-29 ENCOUNTER — Encounter: Payer: Self-pay | Admitting: Physician Assistant

## 2021-11-29 ENCOUNTER — Ambulatory Visit: Payer: Self-pay | Admitting: Physician Assistant

## 2021-11-29 DIAGNOSIS — S91331A Puncture wound without foreign body, right foot, initial encounter: Secondary | ICD-10-CM

## 2021-11-29 MED ORDER — CIPROFLOXACIN HCL 500 MG PO TABS: 500.0000 mg | ORAL_TABLET | Freq: Two times a day (BID) | ORAL | 0 refills | Status: DC

## 2021-11-29 NOTE — Addendum Note (Signed)
Addended by: Gardner Candle on: 11/29/2021 03:43 PM ? ? Modules accepted: Orders ? ?

## 2021-11-29 NOTE — Progress Notes (Signed)
? ?  Subjective: ?Puncture wound plantar aspect right foot  ? ? Patient ID: Francisco Harvey, male    DOB: 10-11-1965, 56 y.o.   MRN: [id number] ? ?HPI ?Patient presents with pain edema and erythema secondary to a puncture wound which occurred yesterday.  Patient tetanus shot is not up-to-date. ? ?Review of Systems ?Hypertension ?   ?Objective:  ? Physical Exam ?See nurses note for vital signs. ?Puncture wound dorsal aspect right foot plantar aspect of first right metatarsal.  Mild edema and erythema is appreciated. ? ? ? ?   ?Assessment & Plan: Skin infection right foot  ?Tetanus shot given and a prescription for Cipro sent to pharmacy.  Patient will follow-up in 4 days. ? ?

## 2021-12-03 ENCOUNTER — Ambulatory Visit: Payer: Self-pay | Admitting: Physician Assistant

## 2021-12-03 ENCOUNTER — Encounter: Payer: Self-pay | Admitting: Physician Assistant

## 2021-12-03 DIAGNOSIS — S91331D Puncture wound without foreign body, right foot, subsequent encounter: Secondary | ICD-10-CM

## 2021-12-03 NOTE — Progress Notes (Signed)
? ?  Subjective: Puncture wound right foot  ? ? Patient ID: Francisco Harvey, male    DOB: [birth year], 56 y.o.   MRN: [id number] ? ?HPI ?Patient follow-up status post puncture wound plantar aspect of right foot 4 days ago.  Patient placed on antibiotics and given a tetanus shot updated injury.  Patient states much improvement and able to bear weight without discomfort. ? ?Review of Systems ?Hypertension ?   ?Objective:  ? Physical Exam ? ?See nurses note for vital signs. ?Examination of the plantar aspect of right foot reveals mild edema but no erythema.  Neurovascular intact with free Nikkel range of motion. ? ? ?   ?Assessment & Plan: Puncture wound right foot  ? ?Patient advised continue antibiotics until complete course.  Return back to clinic condition worsens. ?

## 2021-12-03 NOTE — Progress Notes (Signed)
Pt presents today for follow up on right foot. Pt states its doing a lot better. ?

## 2022-02-27 ENCOUNTER — Other Ambulatory Visit: Payer: Self-pay

## 2022-02-27 DIAGNOSIS — G4489 Other headache syndrome: Secondary | ICD-10-CM

## 2022-02-27 MED ORDER — SUMATRIPTAN SUCCINATE 100 MG PO TABS: ORAL_TABLET | ORAL | 7 refills | Status: DC

## 2022-06-03 DIAGNOSIS — N5201 Erectile dysfunction due to arterial insufficiency: Secondary | ICD-10-CM | POA: Diagnosis not present

## 2022-06-17 ENCOUNTER — Ambulatory Visit: Payer: Self-pay

## 2022-06-17 DIAGNOSIS — Z0000 Encounter for general adult medical examination without abnormal findings: Secondary | ICD-10-CM

## 2022-06-17 LAB — POCT URINALYSIS DIPSTICK
Bilirubin, UA: NEGATIVE
Glucose, UA: NEGATIVE
Ketones, UA: NEGATIVE
Leukocytes, UA: NEGATIVE
Nitrite, UA: NEGATIVE
Protein, UA: NEGATIVE
Spec Grav, UA: 1.025 (ref 1.010–1.025)
Urobilinogen, UA: 0.2 E.U./dL
pH, UA: 6 (ref 5.0–8.0)

## 2022-06-17 NOTE — Progress Notes (Signed)
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

## 2022-06-18 LAB — CMP12+LP+TP+TSH+6AC+PSA+CBC…
ALT: 28 IU/L (ref 0–44)
AST: 22 IU/L (ref 0–40)
Albumin/Globulin Ratio: 1.9 (ref 1.2–2.2)
Albumin: 4.4 g/dL (ref 3.8–4.9)
Alkaline Phosphatase: 91 IU/L (ref 44–121)
BUN/Creatinine Ratio: 10 (ref 9–20)
BUN: 10 mg/dL (ref 6–24)
Basophils Absolute: 0 10*3/uL (ref 0.0–0.2)
Basos: 1 %
Bilirubin Total: 0.6 mg/dL (ref 0.0–1.2)
Calcium: 8.8 mg/dL (ref 8.7–10.2)
Chloride: 102 mmol/L (ref 96–106)
Chol/HDL Ratio: 4.4 ratio (ref 0.0–5.0)
Cholesterol, Total: 168 mg/dL (ref 100–199)
Creatinine, Ser: 0.97 mg/dL (ref 0.76–1.27)
EOS (ABSOLUTE): 0.1 10*3/uL (ref 0.0–0.4)
Eos: 2 %
Estimated CHD Risk: 0.9 times avg. (ref 0.0–1.0)
Free Thyroxine Index: 1.3 (ref 1.2–4.9)
GGT: 29 IU/L (ref 0–65)
Globulin, Total: 2.3 g/dL (ref 1.5–4.5)
Glucose: 116 mg/dL — ABNORMAL HIGH (ref 70–99)
HDL: 38 mg/dL — ABNORMAL LOW (ref >39)
Hematocrit: 43.5 % (ref 37.5–51.0)
Hemoglobin: 15.2 g/dL (ref 13.0–17.7)
Immature Grans (Abs): 0 10*3/uL (ref 0.0–0.1)
Immature Granulocytes: 0 %
Iron: 107 ug/dL (ref 38–169)
LDH: 163 IU/L (ref 121–224)
LDL Chol Calc (NIH): 106 mg/dL — ABNORMAL HIGH (ref 0–99)
Lymphocytes Absolute: 1.4 10*3/uL (ref 0.7–3.1)
Lymphs: 34 %
MCH: 32.3 pg (ref 26.6–33.0)
MCHC: 34.9 g/dL (ref 31.5–35.7)
MCV: 93 fL (ref 79–97)
Monocytes Absolute: 0.4 10*3/uL (ref 0.1–0.9)
Monocytes: 10 %
Neutrophils Absolute: 2.2 10*3/uL (ref 1.4–7.0)
Neutrophils: 53 %
Phosphorus: 2.6 mg/dL — ABNORMAL LOW (ref 2.8–4.1)
Platelets: 217 10*3/uL (ref 150–450)
Potassium: 4.4 mmol/L (ref 3.5–5.2)
Prostate Specific Ag, Serum: 1.2 ng/mL (ref 0.0–4.0)
RBC: 4.7 x10E6/uL (ref 4.14–5.80)
RDW: 12.5 % (ref 11.6–15.4)
Sodium: 137 mmol/L (ref 134–144)
T3 Uptake Ratio: 28 % (ref 24–39)
T4, Total: 4.6 ug/dL (ref 4.5–12.0)
TSH: 1.53 u[IU]/mL (ref 0.450–4.500)
Total Protein: 6.7 g/dL (ref 6.0–8.5)
Triglycerides: 135 mg/dL (ref 0–149)
Uric Acid: 5 mg/dL (ref 3.8–8.4)
VLDL Cholesterol Cal: 24 mg/dL (ref 5–40)
WBC: 4 10*3/uL (ref 3.4–10.8)
eGFR: 92 mL/min/{1.73_m2} (ref >59)

## 2022-06-18 NOTE — Addendum Note (Signed)
Addended by: Malachy Moan F on: 06/18/2022 01:06 PM   Modules accepted: Orders

## 2022-06-21 ENCOUNTER — Other Ambulatory Visit: Payer: Self-pay | Admitting: Physician Assistant

## 2022-06-21 DIAGNOSIS — I10 Essential (primary) hypertension: Secondary | ICD-10-CM

## 2022-06-24 ENCOUNTER — Other Ambulatory Visit: Payer: Self-pay

## 2022-06-24 DIAGNOSIS — I10 Essential (primary) hypertension: Secondary | ICD-10-CM

## 2022-06-24 MED ORDER — ATENOLOL 50 MG PO TABS: ORAL_TABLET | ORAL | 3 refills | Status: DC

## 2022-06-27 ENCOUNTER — Encounter: Payer: Self-pay | Admitting: Physician Assistant

## 2022-06-27 ENCOUNTER — Ambulatory Visit: Payer: Self-pay | Admitting: Physician Assistant

## 2022-06-27 VITALS — BP 133/79 | HR 66 | Temp 97.6°F | Resp 12 | Ht 70.0 in | Wt 215.0 lb

## 2022-06-27 DIAGNOSIS — Z0000 Encounter for general adult medical examination without abnormal findings: Secondary | ICD-10-CM

## 2022-06-27 NOTE — Progress Notes (Signed)
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

## 2022-06-27 NOTE — Progress Notes (Signed)
[facility]   ____________________________________________   None    (approximate)  I have reviewed the triage vital signs and the nursing notes.   HISTORY  Chief Complaint Employment Physical   HPI Francisco Harvey is a 56 y.o. 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  Patient voices no concerns or complaints.         Past Medical History:  Diagnosis Date   Diverticulitis    Elevated LDL cholesterol level    Hypertension    Kidney stones    Migraines     Patient Active Problem List   Diagnosis Date Noted   Essential hypertension 09/30/2019   BMI 30.0-30.9,adult 09/30/2019    Past Surgical History:  Procedure Laterality Date   knee surgery     ROTATOR CUFF REPAIR     SHOULDER SURGERY      Prior to Admission medications   Medication Sig Start Date End Date Taking? Authorizing Provider  atenolol (TENORMIN) 50 MG tablet TAKE 1 TABLET (50 MG) IN THE MORNING AND 1/2 TABLET( 25 MG) IN THE EVENING 06/24/22  Yes Sable Feil, PA-C  atorvastatin (LIPITOR) 10 MG tablet Take 1 tablet (10 mg total) by mouth daily. 11/26/21  Yes Sable Feil, PA-C  SUMAtriptan (IMITREX) 100 MG tablet May repeat in 2 hours if headache persists or recurs. 02/27/22  Yes Sable Feil, PA-C    Allergies Patient has no known allergies.  Family History  Problem Relation Age of Onset   Diabetes Mother    Heart disease Mother    Lung cancer Father        Non-smoker    Social History Social History   Tobacco Use   Smoking status: Never   Smokeless tobacco: Never  Substance Use Topics   Alcohol use: Not Currently   Drug use: Not Currently    Review of Systems Constitutional: No fever/chills Eyes: No visual changes. ENT: No sore throat. Cardiovascular: Denies chest pain. Respiratory: Denies shortness of breath. Gastrointestinal: No abdominal pain.  No nausea, no vomiting.  No diarrhea.  No  constipation. Genitourinary: Negative for dysuria. Musculoskeletal: Negative for back pain. Skin: Negative for rash. Neurological: Negative for headaches, focal weakness or numbness. Endocrine: Hyperlipidemia  ____________________________________________   PHYSICAL EXAM:  VITAL SIGNS: BP is 133/79, pulse 66, respiration 12, temperature 97.6, patient 96% O2 sat on room air.  Patient weighs 215 pounds and BMI is 20.85. Constitutional: Alert and oriented. Well appearing and in no acute distress. Eyes: Conjunctivae are normal. PERRL. EOMI. Head: Atraumatic. Nose: No congestion/rhinnorhea. Mouth/Throat: Mucous membranes are moist.  Oropharynx non-erythematous. Neck: No stridor.  No cervical spine tenderness to palpation. Hematological/Lymphatic/Immunilogical: No cervical lymphadenopathy. Cardiovascular: Normal rate, regular rhythm. Grossly normal heart sounds.  Good peripheral circulation. Respiratory: Normal respiratory effort.  No retractions. Lungs CTAB. Gastrointestinal: Soft and nontender. No distention. No abdominal bruits. No CVA tenderness. Genitourinary: Deferred Musculoskeletal: No lower extremity tenderness nor edema.  No joint effusions. Neurologic:  Normal speech and language. No gross focal neurologic deficits are appreciated. No gait instability. Skin:  Skin is warm, dry and intact. No rash noted. Psychiatric: Mood and affect are normal. Speech and behavior are normal.  ____________________________________________   LABS         Component Ref Range & Units 10 d ago 11 mo ago 1 yr ago 3 yr ago  Color, UA  yellow yellow yellow Yellow  Clarity, UA  clear clear clear Clear  Glucose, UA  _0   Bilirubin, UA  negative negative negative Negative  Ketones, UA  neg negative negative Negative  Spec Grav, UA 1.010 - 1.025 1.025 1.010 1.010 1.020  Blood, UA  1+ negative negative Negative  pH, UA 5.0 - 8.0 6.0 7.0 7.0 6.0  Protein, UA  _1   Urobilinogen, UA 0.2 or 1.0 E.U./dL 0.2 0.2 0.2 0.2  Nitrite, UA  neg negative negative Negative  Leukocytes, UA _2   Appearance   light light   Odor                        Component Ref Range & Units 10 d ago (06/17/22) 11 mo ago (07/26/21) 1 yr ago (07/19/20) 3 yr ago (05/27/19) 13 yr ago (11/17/08) 13 yr ago (11/15/08)  Glucose 70 - 99 mg/dL 116 High  113 High  132 High  R 113 High  R  94  Uric Acid 3.8 - 8.4 mg/dL 5.0 5.5 CM 5.4 CM 5.1 R, CM    Comment:            Therapeutic target for gout patients: <6.0  BUN 6 - 24 mg/dL _3 R  Creatinine, Ser 0.76 - 1.27 mg/dL 0.97 0.97 0.94 0.89  0.97 R  eGFR >59 mL/min/1.73 92 92      BUN/Creatinine Ratio 9 - _4 Sodium 134 - 144 mmol/L 137 139 142 138  141 R  Potassium 3.5 - 5.2 mmol/L 4.4 4.5 4.8 4.6  4.8 R  Chloride 96 - 106 mmol/L 102 102 104 104  105 R  Calcium 8.7 - 10.2 mg/dL 8.8 9.0 9.3 8.6 Low   9.2 R  Phosphorus 2.8 - 4.1 mg/dL 2.6 Low  2.7 Low  2.5 Low  3.0    Total Protein 6.0 - 8.5 g/dL 6.7 6.7 7.2 6.8    Albumin 3.8 - 4.9 g/dL 4.4 4.5 4.7 4.4    Globulin, Total 1.5 - 4.5 g/dL 2.3 2.2 2.5 2.4    Albumin/Globulin Ratio 1.2 - 2.2 1.9 2.0 1.9 1.8    Bilirubin Total 0.0 - 1.2 mg/dL 0.6 0.5 0.5 0.5    Alkaline Phosphatase 44 - 121 IU/L 91 96 97 CM 87 R    LDH 121 - 224 IU/L 163 171 184 162    AST 0 - 40 IU/L _5 ALT 0 - 44 IU/L _6 GGT 0 - 65 IU/L _7 Iron 38 - 169 ug/dL 107 95 106 116    Cholesterol, Total 100 - 199 mg/dL 168 169 183 171    Triglycerides 0 - 149 mg/dL 135 93 139 105    HDL >39 mg/dL 38 Low  45 45 44    VLDL Cholesterol Cal 5 - 40 mg/dL _8 LDL Chol Calc (NIH) 0 - 99 mg/dL 106 High  107 High  113 High  108 High     Chol/HDL Ratio 0.0 - 5.0 ratio 4.4 3.8 CM 4.1 CM 3.9 CM    Comment:                                   T. Chol/HDL Ratio  Men  Women                               1/2 Avg.Risk  3.4    3.3                                   Avg.Risk  5.0    4.4                                2X Avg.Risk  9.6    7.1                                3X Avg.Risk 23.4   11.0  Estimated CHD Risk 0.0 - 1.0 times avg. 0.9 0.7 CM 0.8 CM 0.7 CM    Comment: The CHD Risk is based on the T. Chol/HDL ratio. Other factors affect CHD Risk such as hypertension, smoking, diabetes, severe obesity, and family history of premature CHD.  TSH 0.450 - 4.500 uIU/mL 1.530 1.980 1.380 2.300    T4, Total 4.5 - 12.0 ug/dL 4.6 4.6 4.5 4.3 Low     T3 Uptake Ratio 24 - 39 % _0 Free Thyroxine Index 1.2 - 4.9 1.3 1.2 1.3 1.2    Prostate Specific Ag, Serum 0.0 - 4.0 ng/mL 1.2 1.0 CM 1.0 CM 0.9 CM    Comment: Roche ECLIA methodology. According to the American Urological Association, Serum PSA should decrease and remain at undetectable levels after radical prostatectomy. The AUA defines biochemical recurrence as an initial PSA value 0.2 ng/mL or greater followed by a subsequent confirmatory PSA value 0.2 ng/mL or greater. Values obtained with different assay methods or kits cannot be used interchangeably. Results cannot be interpreted as absolute evidence of the presence or absence of malignant disease.  WBC 3.4 - 10.8 x10E3/uL 4.0 4.4 4.0 4.4    RBC 4.14 - 5.80 x10E6/uL 4.70 5.00 4.94 4.69    Hemoglobin 13.0 - 17.7 g/dL 15.2 15.8 16.1 15.0 15.1 R   Hematocrit 37.5 - 51.0 % 43.5 46.9 45.5 43.7    MCV 79 - 97 fL 93 94 92 93    MCH 26.6 - 33.0 pg 32.3 31.6 32.6 32.0    MCHC 31.5 - 35.7 g/dL 34.9 33.7 35.4 34.3    RDW 11.6 - 15.4 % 12.5 12.3 12.3 12.2    Platelets 150 - 450 x10E3/uL 217 237 244 205    Neutrophils Not Estab. % 53 56 58 59    Lymphs Not Estab. % 34 _1 Monocytes Not Estab. % _2 Eos Not Estab. % _3 Basos Not Estab. % 1 1 0 0    Neutrophils Absolute 1.4 - 7.0 x10E3/uL 2.2 2.5 2.3 2.6     Lymphocytes Absolute 0.7 - 3.1 x10E3/uL 1.4 1.3 1.2 1.3    Monocytes Absolute 0.1 - 0.9 x10E3/uL 0.4 0.4 0.4 0.4    EOS (ABSOLUTE) 0.0 - 0.4 x10E3/uL 0.1 0.2 0.1 0.1    Basophils Absolute 0.0 - 0.2 x10E3/uL 0.0 0.0 0.0 0.0    Immature Granulocytes Not Estab. % 0 0 0 0  ____________________________________________  EKG  Sinus bradycardia at 59 beats per minute ____________________________________________   ____________________________________________   INITIAL IMPRESSION / ASSESSMENT AND PLAN  As part of my medical decision making, I reviewed the following data within the [facility]       Discussed EKG and lab results with patient.     ____________________________________________   FINAL CLINICAL IMPRESSION Well exam   ED Discharge Orders     None        Note:  This document was prepared using Dragon voice recognition software and may include unintentional dictation errors.

## 2022-07-02 ENCOUNTER — Other Ambulatory Visit: Payer: Self-pay | Admitting: Physician Assistant

## 2022-07-02 DIAGNOSIS — H5213 Myopia, bilateral: Secondary | ICD-10-CM | POA: Diagnosis not present

## 2022-07-02 DIAGNOSIS — Z1152 Encounter for screening for COVID-19: Secondary | ICD-10-CM

## 2022-07-02 DIAGNOSIS — H5310 Unspecified subjective visual disturbances: Secondary | ICD-10-CM | POA: Diagnosis not present

## 2022-07-02 LAB — POCT INFLUENZA A/B
Influenza A, POC: NEGATIVE
Influenza B, POC: NEGATIVE

## 2022-07-02 LAB — POC COVID19 BINAXNOW: SARS Coronavirus 2 Ag: NEGATIVE

## 2022-07-02 MED ORDER — PSEUDOEPH-BROMPHEN-DM 30-2-10 MG/5ML PO SYRP: 5.0000 mL | ORAL_SOLUTION | Freq: Four times a day (QID) | ORAL | 0 refills | Status: AC | PRN

## 2022-07-02 MED ORDER — AZITHROMYCIN 250 MG PO TABS: ORAL_TABLET | ORAL | 0 refills | Status: AC

## 2022-07-02 NOTE — Progress Notes (Signed)
Pt presents today with cough, sinus pressure, sore throat from drainage. X3 days.

## 2022-07-26 DIAGNOSIS — R051 Acute cough: Secondary | ICD-10-CM | POA: Diagnosis not present

## 2022-09-17 ENCOUNTER — Other Ambulatory Visit: Payer: Self-pay

## 2022-09-17 DIAGNOSIS — Z0283 Encounter for blood-alcohol and blood-drug test: Secondary | ICD-10-CM

## 2022-09-17 NOTE — Progress Notes (Signed)
PT cleared ETOH,  UDS is pending with [facility].

## 2022-10-24 ENCOUNTER — Other Ambulatory Visit: Payer: Self-pay | Admitting: Physician Assistant

## 2022-10-24 DIAGNOSIS — E785 Hyperlipidemia, unspecified: Secondary | ICD-10-CM

## 2022-11-27 ENCOUNTER — Ambulatory Visit: Payer: Self-pay | Admitting: Physician Assistant

## 2022-11-27 ENCOUNTER — Encounter: Payer: Self-pay | Admitting: Physician Assistant

## 2022-11-27 VITALS — BP 117/86 | HR 61 | Temp 98.4°F | Resp 12 | Ht 70.0 in | Wt 215.0 lb

## 2022-11-27 DIAGNOSIS — R109 Unspecified abdominal pain: Secondary | ICD-10-CM

## 2022-11-27 DIAGNOSIS — U071 COVID-19: Secondary | ICD-10-CM

## 2022-11-27 DIAGNOSIS — R0982 Postnasal drip: Secondary | ICD-10-CM

## 2022-11-27 LAB — POCT URINALYSIS DIPSTICK
Bilirubin, UA: NEGATIVE
Blood, UA: NEGATIVE
Glucose, UA: NEGATIVE
Ketones, UA: NEGATIVE
Leukocytes, UA: NEGATIVE
Nitrite, UA: NEGATIVE
Protein, UA: NEGATIVE
Spec Grav, UA: 1.01 (ref 1.010–1.025)
Urobilinogen, UA: 0.2 E.U./dL
pH, UA: 6 (ref 5.0–8.0)

## 2022-11-27 LAB — POC COVID19 BINAXNOW: SARS Coronavirus 2 Ag: POSITIVE — AB

## 2022-11-27 NOTE — Progress Notes (Signed)
Pt presents today for left flank pain off and on with chills with chills no fever.   Along with cough 2 days ago no longer have the cough but did get congested and drainage but mostly cleared up through out the day today. Pt was not aware of having or being exposed to covid.   Pt tested positive for covid today.

## 2022-11-27 NOTE — Progress Notes (Signed)
   Subjective: Left flank pain    Patient ID: Francisco Harvey, male    DOB: [birth year], 57 y.o.   MRN: [id number]  HPI Patient complain of left leg pain, myalgia, and cold symptoms for 2 days.  Patient was concerned secondary to history of polycystic bilateral kidneys.  Patient denies dysuria, frequency, hesitancy, or hematuria.  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  Patient test positive today for COVID-19.   Review of Systems Hypertension    Objective:   Physical Exam BP is 117/86, pulse 69, respiration 12, temperature 98.4, patient 94% O2 sat on room air.  Patient return to 15 pounds and BMI is 30.85. HEENT was unremarkable. Neck is supple for lymphadenopathy or bruits. Lungs are clear to auscultation. Heart regular rate and rhythm.  Patient positive for COVID-19.  Negative for influenza. Urinalysis unremarkable.      Assessment & Plan: Myalgias secondary to COVID-19.   Patient advised follow CDC recommendations.  Patient advised to continue over-the-counter medications.  Patient advised to establish care with urologist.

## 2022-12-20 ENCOUNTER — Other Ambulatory Visit: Payer: Self-pay | Admitting: Physician Assistant

## 2022-12-20 DIAGNOSIS — G4489 Other headache syndrome: Secondary | ICD-10-CM

## 2023-01-17 ENCOUNTER — Encounter: Payer: Self-pay | Admitting: Physician Assistant

## 2023-01-17 ENCOUNTER — Ambulatory Visit: Payer: 59 | Admitting: Physician Assistant

## 2023-01-17 DIAGNOSIS — S7012XA Contusion of left thigh, initial encounter: Secondary | ICD-10-CM

## 2023-01-17 NOTE — Progress Notes (Signed)
Last Thursday while sitting in a truck, felt a "pop" in left thigh - felt like someone kneed the muscle.  Felt a cramping like pain & pain when got out of truck & stretched out the leg. Bruise showed up to inner left thigh about 2-3 days later.  Today: Still has bruise to left inner thigh - healing Outside left knee feels tender to the touch Left leg achy yesterday - uncomfortable during sleep Took some tylenol yesterday Full ROM No sharp pain radiating up or down the leg Not affecting activities of daily living.  Durward Parcel, PA-C evaluated & ordered US of LLE  AMD

## 2023-01-17 NOTE — Addendum Note (Signed)
Addended by: Gardner Candle on: 01/17/2023 11:39 AM   Modules accepted: Orders

## 2023-01-17 NOTE — Addendum Note (Signed)
Addended by: Gardner Candle on: 01/17/2023 01:26 PM   Modules accepted: Orders

## 2023-01-17 NOTE — Progress Notes (Signed)
   Subjective: Pain and ecchymosis left thigh    Patient ID: Francisco Harvey, male    DOB: [birth year], 57 y.o.   MRN: [id number]  HPI Patient presents for complaint of sudden atraumatic pain to the medial left thigh.  States pain was followed the following day bruising.  Incident occurred 1 week ago.  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  Patient states he immediately flexes lower leg to get some relief.  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  Patient stated able to continue his regular duties.  Presents today with 8 cm circumferential area of the left medial thigh.  Moderate guarding with palpation.  erythema.  Moderate guarding with palpation. Review of Systems Hypertension    Objective:   Physical Exam  No acute distress.  Patient presents with 8 cm circular area of ecchymosis to the left medial thigh.  Moderate guarding with palpation.  Distal pulses are intact.      Assessment & Plan: Atraumatic left thigh pain and ecchymosis  Further evaluation with ultrasound is warranted.

## 2023-01-22 ENCOUNTER — Ambulatory Visit
Admission: RE | Admit: 2023-01-22 | Discharge: 2023-01-22 | Disposition: A | Discharge status: Home / Self Care | Payer: 59 | Source: Ambulatory Visit | Attending: Physician Assistant | Admitting: Physician Assistant

## 2023-01-22 DIAGNOSIS — S7012XA Contusion of left thigh, initial encounter: Secondary | ICD-10-CM | POA: Insufficient documentation

## 2023-01-22 DIAGNOSIS — M79605 Pain in left leg: Secondary | ICD-10-CM | POA: Diagnosis not present

## 2023-01-23 ENCOUNTER — Other Ambulatory Visit: Payer: Self-pay

## 2023-01-23 DIAGNOSIS — G4489 Other headache syndrome: Secondary | ICD-10-CM

## 2023-01-23 MED ORDER — SUMATRIPTAN SUCCINATE 100 MG PO TABS: ORAL_TABLET | ORAL | 7 refills | Status: DC

## 2023-01-29 ENCOUNTER — Ambulatory Visit
Admission: RE | Admit: 2023-01-29 | Discharge: 2023-01-29 | Disposition: A | Discharge status: Home / Self Care | Payer: 59 | Attending: Urology | Admitting: Urology

## 2023-01-29 ENCOUNTER — Ambulatory Visit: Payer: 59 | Admitting: Urology

## 2023-01-29 ENCOUNTER — Other Ambulatory Visit: Payer: BLUE CROSS/BLUE SHIELD

## 2023-01-29 ENCOUNTER — Ambulatory Visit
Admission: RE | Admit: 2023-01-29 | Discharge: 2023-01-29 | Disposition: A | Discharge status: Home / Self Care | Payer: 59 | Source: Ambulatory Visit | Attending: Urology | Admitting: Urology

## 2023-01-29 ENCOUNTER — Encounter: Payer: Self-pay | Admitting: Urology

## 2023-01-29 VITALS — BP 146/101 | HR 63 | Ht 70.0 in | Wt 215.0 lb

## 2023-01-29 DIAGNOSIS — N200 Calculus of kidney: Secondary | ICD-10-CM | POA: Diagnosis not present

## 2023-01-29 DIAGNOSIS — Z87442 Personal history of urinary calculi: Secondary | ICD-10-CM

## 2023-01-29 DIAGNOSIS — N529 Male erectile dysfunction, unspecified: Secondary | ICD-10-CM

## 2023-01-29 DIAGNOSIS — Z125 Encounter for screening for malignant neoplasm of prostate: Secondary | ICD-10-CM | POA: Diagnosis not present

## 2023-01-29 NOTE — Progress Notes (Signed)
Albin Fischer Abdulla,acting as a scribe for Riki Altes, MD.,have documented all relevant documentation on the behalf of Riki Altes, MD,as directed by  Riki Altes, MD while in the presence of Riki Altes, MD.   01/29/2023 9:55 AM   Francisco Harvey Nov 05, 1965 [id number]  Referring provider: No referring provider defined for this encounter.  Chief Complaint  Patient presents with   New Patient (Initial Visit)    HPI: Francisco Harvey is a 57 y.o. male presents to transfer urologic care.  Previously followed by Dr. Evelene Croon for erectile dysfunction and history of nephrolithiasis. Last saw Dr. Evelene Croon September 2022 and was using Sildenafil as needed. 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 Presently asymptomatic, notes twinges of flank pain on occasions. No recent abdominal imaging. No longer using Sildenafil and taking an over the counter supplement "blue chew" which he states has been effective. He gets his PSA annually with the city and states it was done in January 2024 and stable.   PMH: Past Medical History:  Diagnosis Date   Diverticulitis    Elevated LDL cholesterol level    Hypertension    Kidney stones    Migraines     Surgical History: Past Surgical History:  Procedure Laterality Date   knee surgery     ROTATOR CUFF REPAIR     SHOULDER SURGERY      Home Medications:  Allergies as of 01/29/2023   No Known Allergies      Medication List        Accurate as of January 29, 2023  9:55 AM. If you have any questions, ask your nurse or doctor.          atenolol 50 MG tablet Commonly known as: TENORMIN TAKE 1 TABLET (50 MG) IN THE MORNING AND 1/2 TABLET( 25 MG) IN THE EVENING   atorvastatin 10 MG tablet Commonly known as: LIPITOR Take 1 tablet (10 mg total) by mouth daily.   brompheniramine-pseudoephedrine-DM  30-2-10 MG/5ML syrup Take 5 mLs by mouth 4 (four) times daily as needed.   SUMAtriptan 100 MG tablet Commonly known as: IMITREX May repeat in 2 hours if headache persists or recurs.        Family History: Family History  Problem Relation Age of Onset   Diabetes Mother    Heart disease Mother    Lung cancer Father        Non-smoker    Social History:  reports that he has never smoked. He has never used smokeless tobacco. He reports that he does not currently use alcohol. He reports that he does not currently use drugs.   Physical Exam: BP (!) 146/101   Pulse 63   Ht 5\' 10"  (1.778 m)   Wt 215 lb (97.5 kg)   BMI 30.85 kg/m   Constitutional:  Alert and oriented, No acute distress. HEENT: [place] AT Respiratory: Normal respiratory effort, no increased work of breathing. GI: Abdomen is soft, nontender, nondistended, no abdominal masses GU: Prostate 40 g, smooth without nodules. Psychiatric: Normal mood and affect.   Assessment & Plan:    1. History of nephrolithiasis KUB ordered and will call with results. He desis to continue annual follow up, but will call back to schedule.  2.  Erectile dysfunction Mild ED; currently using an OTC supplement which has been effective  3. Prostate  cancer screening PSA done annually with the [facility], He desires to continue annual DRE and will follow up in one year.  I have reviewed the above documentation for accuracy and completeness, and I agree with the above.   Riki Altes, MD  [facility] [address] [place], [place] [address] [phone]

## 2023-01-30 ENCOUNTER — Other Ambulatory Visit: Payer: Self-pay

## 2023-01-30 DIAGNOSIS — E785 Hyperlipidemia, unspecified: Secondary | ICD-10-CM

## 2023-01-30 MED ORDER — ATORVASTATIN CALCIUM 10 MG PO TABS
10.0000 mg | ORAL_TABLET | Freq: Every day | ORAL | 3 refills | Status: DC
Start: 2023-01-30 — End: 2023-11-07

## 2023-06-30 ENCOUNTER — Ambulatory Visit: Payer: Self-pay

## 2023-06-30 DIAGNOSIS — Z0289 Encounter for other administrative examinations: Secondary | ICD-10-CM

## 2023-06-30 LAB — POCT URINALYSIS DIPSTICK
Bilirubin, UA: NEGATIVE
Blood, UA: NEGATIVE
Glucose, UA: NEGATIVE
Ketones, UA: NEGATIVE
Leukocytes, UA: NEGATIVE
Nitrite, UA: NEGATIVE
Protein, UA: NEGATIVE
Spec Grav, UA: 1.015 (ref 1.010–1.025)
Urobilinogen, UA: 0.2 U/dL
pH, UA: 5 (ref 5.0–8.0)

## 2023-06-30 NOTE — Progress Notes (Unsigned)
Pt presents today to complete labs for FF physical. Francisco Harvey

## 2023-07-01 LAB — CMP12+LP+TP+TSH+6AC+PSA+CBC…
ALT: 27 [IU]/L (ref 0–44)
AST: 21 [IU]/L (ref 0–40)
Albumin: 4.3 g/dL (ref 3.8–4.9)
Alkaline Phosphatase: 82 [IU]/L (ref 44–121)
BUN/Creatinine Ratio: 18 (ref 9–20)
BUN: 15 mg/dL (ref 6–24)
Basophils Absolute: 0 10*3/uL (ref 0.0–0.2)
Basos: 0 %
Bilirubin Total: 0.5 mg/dL (ref 0.0–1.2)
Calcium: 8.5 mg/dL — ABNORMAL LOW (ref 8.7–10.2)
Chloride: 104 mmol/L (ref 96–106)
Chol/HDL Ratio: 4.2 ratio (ref 0.0–5.0)
Cholesterol, Total: 186 mg/dL (ref 100–199)
Creatinine, Ser: 0.85 mg/dL (ref 0.76–1.27)
EOS (ABSOLUTE): 0.1 10*3/uL (ref 0.0–0.4)
Eos: 2 %
Estimated CHD Risk: 0.8 times avg. (ref 0.0–1.0)
Free Thyroxine Index: 1.1 — ABNORMAL LOW (ref 1.2–4.9)
GGT: 27 [IU]/L (ref 0–65)
Globulin, Total: 2.3 g/dL (ref 1.5–4.5)
Glucose: 117 mg/dL — ABNORMAL HIGH (ref 70–99)
HDL: 44 mg/dL (ref >39)
Hematocrit: 42.6 % (ref 37.5–51.0)
Hemoglobin: 14.4 g/dL (ref 13.0–17.7)
Immature Grans (Abs): 0 10*3/uL (ref 0.0–0.1)
Immature Granulocytes: 0 %
Iron: 131 ug/dL (ref 38–169)
LDH: 167 [IU]/L (ref 121–224)
LDL Chol Calc (NIH): 121 mg/dL — ABNORMAL HIGH (ref 0–99)
Lymphocytes Absolute: 1.4 10*3/uL (ref 0.7–3.1)
Lymphs: 35 %
MCH: 32.4 pg (ref 26.6–33.0)
MCHC: 33.8 g/dL (ref 31.5–35.7)
MCV: 96 fL (ref 79–97)
Monocytes Absolute: 0.4 10*3/uL (ref 0.1–0.9)
Monocytes: 10 %
Neutrophils Absolute: 2.2 10*3/uL (ref 1.4–7.0)
Neutrophils: 53 %
Phosphorus: 2.5 mg/dL — ABNORMAL LOW (ref 2.8–4.1)
Platelets: 237 10*3/uL (ref 150–450)
Potassium: 4.5 mmol/L (ref 3.5–5.2)
Prostate Specific Ag, Serum: 1.3 ng/mL (ref 0.0–4.0)
RBC: 4.45 x10E6/uL (ref 4.14–5.80)
RDW: 12.3 % (ref 11.6–15.4)
Sodium: 138 mmol/L (ref 134–144)
T3 Uptake Ratio: 26 % (ref 24–39)
T4, Total: 4.4 ug/dL — ABNORMAL LOW (ref 4.5–12.0)
TSH: 2.09 u[IU]/mL (ref 0.450–4.500)
Total Protein: 6.6 g/dL (ref 6.0–8.5)
Triglycerides: 114 mg/dL (ref 0–149)
Uric Acid: 5.1 mg/dL (ref 3.8–8.4)
VLDL Cholesterol Cal: 21 mg/dL (ref 5–40)
WBC: 4 10*3/uL (ref 3.4–10.8)
eGFR: 101 mL/min/{1.73_m2} (ref >59)

## 2023-07-03 LAB — HGB A1C W/O EAG: Hgb A1c MFr Bld: 6.2 % — ABNORMAL HIGH (ref 4.8–5.6)

## 2023-07-03 LAB — SPECIMEN STATUS REPORT

## 2023-07-15 ENCOUNTER — Encounter: Payer: Self-pay | Admitting: Physician Assistant

## 2023-07-15 ENCOUNTER — Ambulatory Visit: Payer: Self-pay | Admitting: Physician Assistant

## 2023-07-15 VITALS — BP 142/79 | HR 74 | Temp 97.4°F | Resp 14 | Ht 70.0 in | Wt 222.0 lb

## 2023-07-15 DIAGNOSIS — Z0000 Encounter for general adult medical examination without abnormal findings: Secondary | ICD-10-CM

## 2023-07-15 NOTE — Progress Notes (Signed)
Pt presents today to complete FF physical, Pt didn't voice any concerns at this time/CL,RMA

## 2023-07-15 NOTE — Progress Notes (Signed)
[facility]  ____________________________________________   None    (approximate)  I have reviewed the triage vital signs and the nursing notes.   HISTORY  Chief Complaint Employment Physical   HPI Francisco Harvey is a 57 y.o. male patient presents for annual physical exam.  Patient with no concerning complaints.         Past Medical History:  Diagnosis Date   Diverticulitis    Elevated LDL cholesterol level    Hypertension    Kidney stones    Migraines     Patient Active Problem List   Diagnosis Date Noted   Essential hypertension 09/30/2019   BMI 30.0-30.9,adult 09/30/2019    Past Surgical History:  Procedure Laterality Date   knee surgery     ROTATOR CUFF REPAIR     SHOULDER SURGERY      Prior to Admission medications   Medication Sig Start Date End Date Taking? Authorizing Provider  atenolol (TENORMIN) 50 MG tablet TAKE 1 TABLET (50 MG) IN THE MORNING AND 1/2 TABLET( 25 MG) IN THE EVENING 06/24/22   Joni Reining, PA-C  atorvastatin (LIPITOR) 10 MG tablet Take 1 tablet (10 mg total) by mouth daily. 01/30/23   Tommi Rumps, PA-C  brompheniramine-pseudoephedrine-DM 30-2-10 MG/5ML syrup Take 5 mLs by mouth 4 (four) times daily as needed. 07/02/22   Joni Reining, PA-C  SUMAtriptan (IMITREX) 100 MG tablet May repeat in 2 hours if headache persists or recurs. 01/23/23   Tommi Rumps, PA-C    Allergies Patient has no known allergies.  Family History  Problem Relation Age of Onset   Diabetes Mother    Heart disease Mother    Lung cancer Father        Non-smoker    Social History Social History   Tobacco Use   Smoking status: Never   Smokeless tobacco: Never  Substance Use Topics   Alcohol use: Not Currently   Drug use: Not Currently    Review of Systems Constitutional: No fever/chills Eyes: No visual changes. ENT: No sore throat. Cardiovascular: Denies chest pain. Respiratory: Denies  shortness of breath. Gastrointestinal: No abdominal pain.  No nausea, no vomiting.  No diarrhea.  No constipation. Genitourinary: Negative for dysuria. Musculoskeletal: Negative for back pain. Skin: Negative for rash. Neurological: Positive for migraine headaches, denies focal weakness or numbness. Endocrine: Hyperlipidemia and hypertension ____________________________________________   PHYSICAL EXAM:  VITAL SIGNS: Constitutional: Alert and oriented. Well appearing and in no acute distress. Eyes: Conjunctivae are normal. PERRL. EOMI. Head: Atraumat BP 142/79  Pulse 74  Resp 14  Temp 97.4 F (36.3 C)  Temp src Temporal  SpO2 96 %  Weight 222 lb (100.7 kg)  Height 5\' 10"  (1.778 m)   BMI 31.85 kg/m2  BSA 2.23 m2   Nose: No congestion/rhinnorhea. Mouth/Throat: Mucous membranes are moist.  Oropharynx non-erythematous. Neck: No stridor.  No cervical spine tenderness to palpation. Hematological/Lymphatic/Immunilogical: No cervical lymphadenopathy. ardiovascular: Normal rate, regular rhythm. Grossly normal heart sounds.  Good peripheral circulation. Respiratory: Normal respiratory effort.  No retractions. Lungs CTAB. Gastrointestinal: Soft and nontender. No distention. No abdominal bruits. No CVA tenderness. Genitourinary: Deferred Musculoskeletal: No lower extremity tenderness nor edema.  No joint effusions. Neurologic:  Normal speech and language. No gross focal neurologic deficits are appreciated. No gait instability. Skin:  Skin is warm, dry and intact. No rash noted. Psychiatric: Mood and affect are normal. Speech and behavior are normal.  ____________________________________________   LABS ____  Component Ref Range & Units 2 wk ago (06/30/23) 7 mo ago (11/27/22) 1 yr ago (06/17/22) 1 yr ago (07/26/21) 2 yr ago (07/19/20) 4 yr ago (05/27/19)  Color, UA yellow yellow yellow yellow yellow Yellow  Clarity, UA clear clear clear clear clear Clear  Glucose,  UA Negative Negative Negative Negative Negative Negative Negative  Bilirubin, UA neg neg negative negative negative Negative  Ketones, UA neg neg neg negative negative Negative  Spec Grav, UA 1.010 - 1.025 1.015 1.010 1.025 1.010 1.010 1.020  Blood, UA neg neg 1+ negative negative Negative  pH, UA 5.0 - 8.0 5.0 6.0 6.0 7.0 7.0 6.0  Protein, UA Negative Negative Negative Negative Negative Negative Negative  Urobilinogen, UA 0.2 or 1.0 E.U./dL 0.2 0.2 0.2 0.2 0.2 0.2  Nitrite, UA neg neg neg negative negative Negative  Leukocytes, UA Negative Negative Negative Negative Negative Negative Negative  Appearance    light light   Odor                    View All Conversations on this Encounter                   Component Ref Range & Units 2 wk ago (06/30/23) 1 yr ago (06/17/22) 1 yr ago (07/26/21) 2 yr ago (07/19/20) 4 yr ago (05/27/19) 14 yr ago (11/17/08) 14 yr ago (11/15/08)  Glucose 70 - 99 mg/dL 914 High  782 High  956 High  132 High  R 113 High  R  94  Uric Acid 3.8 - 8.4 mg/dL 5.1 5.0 CM 5.5 CM 5.4 CM 5.1 R, CM    Comment:            Therapeutic target for gout patients: <6.0  BUN 6 - 24 mg/dL 15 10 10 11 16  10  R  Creatinine, Ser 0.76 - 1.27 mg/dL 2.13 0.86 5.78 4.69 6.29  0.97 R  eGFR >59 mL/min/1.73 101 92 92      BUN/Creatinine Ratio 9 - 20 18 10 10 12 18     Sodium 134 - 144 mmol/L 138 137 139 142 138  141 R  Potassium 3.5 - 5.2 mmol/L 4.5 4.4 4.5 4.8 4.6  4.8 R  Chloride 96 - 106 mmol/L 104 102 102 104 104  105 R  Calcium 8.7 - 10.2 mg/dL 8.5 Low  8.8 9.0 9.3 8.6 Low   9.2 R  Phosphorus 2.8 - 4.1 mg/dL 2.5 Low  2.6 Low  2.7 Low  2.5 Low  3.0    Total Protein 6.0 - 8.5 g/dL 6.6 6.7 6.7 7.2 6.8    Albumin 3.8 - 4.9 g/dL 4.3 4.4 4.5 4.7 4.4    Globulin, Total 1.5 - 4.5 g/dL 2.3 2.3 2.2 2.5 2.4    Bilirubin Total 0.0 - 1.2 mg/dL 0.5 0.6 0.5 0.5 0.5    Alkaline Phosphatase 44 - 121 IU/L 82 91 96 97 CM 87 R    LDH 121 - 224 IU/L 167 163 171 184 162     AST 0 - 40 IU/L 21 22 20 20 23     ALT 0 - 44 IU/L 27 28 27 28 31     GGT 0 - 65 IU/L 27 29 26 28 27     Iron 38 - 169 ug/dL 528 413 95 244 010    Cholesterol, Total 100 - 199 mg/dL 272 536 644 034 742    Triglycerides 0 - 149 mg/dL 595 638 93 756 433  HDL >39 mg/dL 44 38 Low  45 45 44    VLDL Cholesterol Cal 5 - 40 mg/dL 21 24 17 25 19     LDL Chol Calc (NIH) 0 - 99 mg/dL 914 High  782 High  956 High  113 High  108 High     Chol/HDL Ratio 0.0 - 5.0 ratio 4.2 4.4 CM 3.8 CM 4.1 CM 3.9 CM    Comment:                                   T. Chol/HDL Ratio                                             Men  Women                               1/2 Avg.Risk  3.4    3.3                                   Avg.Risk  5.0    4.4                                2X Avg.Risk  9.6    7.1                                3X Avg.Risk 23.4   11.0  Estimated CHD Risk 0.0 - 1.0 times avg. 0.8 0.9 CM 0.7 CM 0.8 CM 0.7 CM    Comment: The CHD Risk is based on the T. Chol/HDL ratio. Other factors affect CHD Risk such as hypertension, smoking, diabetes, severe obesity, and family history of premature CHD.  TSH 0.450 - 4.500 uIU/mL 2.090 1.530 1.980 1.380 2.300    T4, Total 4.5 - 12.0 ug/dL 4.4 Low  4.6 4.6 4.5 4.3 Low     T3 Uptake Ratio 24 - 39 % 26 28 25 28 27     Free Thyroxine Index 1.2 - 4.9 1.1 Low  1.3 1.2 1.3 1.2    Prostate Specific Ag, Serum 0.0 - 4.0 ng/mL 1.3 1.2 CM 1.0 CM 1.0 CM 0.9 CM    Comment: Roche ECLIA methodology. According to the American Urological Association, Serum PSA should decrease and remain at undetectable levels after radical prostatectomy. The AUA defines biochemical recurrence as an initial PSA value 0.2 ng/mL or greater followed by a subsequent confirmatory PSA value 0.2 ng/mL or greater. Values obtained with different assay methods or kits cannot be used interchangeably. Results cannot be interpreted as absolute evidence of the presence or absence of malignant  disease.  WBC 3.4 - 10.8 x10E3/uL 4.0 4.0 4.4 4.0 4.4    RBC 4.14 - 5.80 x10E6/uL 4.45 4.70 5.00 4.94 4.69    Hemoglobin 13.0 - 17.7 g/dL 21.3 08.6 57.8 46.9 62.9 15.1 R   Hematocrit 37.5 - 51.0 % 42.6 43.5 46.9 45.5 43.7    MCV 79 - 97 fL 96 93 94 92 93    MCH 26.6 - 33.0 pg 32.4 32.3 31.6 32.6 32.0  MCHC 31.5 - 35.7 g/dL 74.2 59.5 63.8 75.6 43.3    RDW 11.6 - 15.4 % 12.3 12.5 12.3 12.3 12.2    Platelets 150 - 450 x10E3/uL 237 217 237 244 205    Neutrophils Not Estab. % 53 53 56 58 59    Lymphs Not Estab. % 35 34 30 31 30     Monocytes Not Estab. % 10 10 8 9 9     Eos Not Estab. % 2 2 5 2 2     Basos Not Estab. % 0 1 1 0 0    Neutrophils Absolute 1.4 - 7.0 x10E3/uL 2.2 2.2 2.5 2.3 2.6    Lymphocytes Absolute 0.7 - 3.1 x10E3/uL 1.4 1.4 1.3 1.2 1.3    Monocytes Absolute 0.1 - 0.9 x10E3/uL 0.4 0.4 0.4 0.4 0.4    EOS (ABSOLUTE) 0.0 - 0.4 x10E3/uL 0.1 0.1 0.2 0.1 0.1    Basophils Absolute 0.0 - 0.2 x10E3/uL 0.0 0.0 0.0 0.0 0.0    Immature Granulocytes Not Estab. % 0 0 0 0 0    Immature Grans (Abs) 0.0 - 0.1 x10E3/uL 0.0 0.0 0.0 0.0 0.0    Resulting Agency [facility]         Narrative Performed by: Verdell Carmine Performed at:  [address] - [facility] [address] [place], [place]  [id number] Lab Director: Jolene Schimke MD, Phone:  [phone]         View All Conversations on this Encounter              Component Ref Range & Units 2 wk ago 1 yr ago 2 yr ago  Hgb A1c MFr Bld 4.8 - 5.6 % 6.2 High  6.1 High  CM 5.2 R          ___________________________  EKG  Sinus bradycardia 54 bpm ____________________________________________    ____________________________________________   INITIAL IMPRESSION / ASSESSMENT AND PLAN   As part of my medical decision making, I reviewed the following data within the electronic MEDICAL RECORD NUMBER      No acute findings on physical exam, EKG, or labs.         ____________________________________________   FINAL CLINICAL IMPRESSION Well exam   ED Discharge Orders     None        Note:  This document was prepared using Dragon voice recognition software and may include unintentional dictation errors.

## 2023-10-06 ENCOUNTER — Other Ambulatory Visit: Payer: Self-pay

## 2023-10-06 DIAGNOSIS — Z0283 Encounter for blood-alcohol and blood-drug test: Secondary | ICD-10-CM

## 2023-10-06 NOTE — Progress Notes (Signed)
 Random UDS and BAT completed per protocol after consent for COB.  Cleared BAT and UDS sent to [facility].

## 2023-10-27 ENCOUNTER — Other Ambulatory Visit: Payer: Self-pay

## 2023-10-27 DIAGNOSIS — I10 Essential (primary) hypertension: Secondary | ICD-10-CM

## 2023-10-27 MED ORDER — ATENOLOL 50 MG PO TABS
ORAL_TABLET | ORAL | 3 refills | Status: AC
Start: 2023-10-27 — End: ?

## 2023-11-07 ENCOUNTER — Other Ambulatory Visit: Payer: Self-pay

## 2023-11-07 DIAGNOSIS — E785 Hyperlipidemia, unspecified: Secondary | ICD-10-CM

## 2023-11-07 MED ORDER — ATORVASTATIN CALCIUM 10 MG PO TABS
10.0000 mg | ORAL_TABLET | Freq: Every day | ORAL | 3 refills | Status: AC
Start: 2023-11-07 — End: ?

## 2023-11-10 ENCOUNTER — Other Ambulatory Visit: Payer: Self-pay

## 2023-11-10 DIAGNOSIS — G4489 Other headache syndrome: Secondary | ICD-10-CM

## 2023-11-10 MED ORDER — SUMATRIPTAN SUCCINATE 100 MG PO TABS: ORAL_TABLET | ORAL | 7 refills | Status: DC

## 2024-01-19 ENCOUNTER — Telehealth: Payer: Self-pay

## 2024-01-19 ENCOUNTER — Other Ambulatory Visit: Payer: Self-pay | Admitting: Physician Assistant

## 2024-01-19 MED ORDER — NAPROXEN 500 MG PO TABS: 500.0000 mg | ORAL_TABLET | Freq: Two times a day (BID) | ORAL | 0 refills | Status: AC

## 2024-01-19 MED ORDER — ORPHENADRINE CITRATE ER 100 MG PO TB12: 100.0000 mg | ORAL_TABLET | Freq: Two times a day (BID) | ORAL | 0 refills | Status: AC

## 2024-01-19 NOTE — Telephone Encounter (Signed)
 Notified provider of complaints of back spasms from his history of this in the past and at home resting with heating pad and NSAIDs.

## 2024-02-05 DIAGNOSIS — M2022 Hallux rigidus, left foot: Secondary | ICD-10-CM | POA: Diagnosis not present

## 2024-02-05 DIAGNOSIS — M2021 Hallux rigidus, right foot: Secondary | ICD-10-CM | POA: Diagnosis not present

## 2024-02-05 DIAGNOSIS — M79671 Pain in right foot: Secondary | ICD-10-CM | POA: Diagnosis not present

## 2024-02-05 DIAGNOSIS — M79672 Pain in left foot: Secondary | ICD-10-CM | POA: Diagnosis not present

## 2024-06-28 ENCOUNTER — Ambulatory Visit: Payer: Self-pay

## 2024-06-28 DIAGNOSIS — Z0289 Encounter for other administrative examinations: Secondary | ICD-10-CM

## 2024-06-28 LAB — POCT URINALYSIS DIPSTICK
Bilirubin, UA: NEGATIVE
Blood, UA: NEGATIVE
Glucose, UA: NEGATIVE
Ketones, UA: NEGATIVE
Leukocytes, UA: NEGATIVE
Nitrite, UA: NEGATIVE
Protein, UA: NEGATIVE
Spec Grav, UA: <=1.005 — AB (ref 1.010–1.025)
Urobilinogen, UA: 0.2 U/dL
pH, UA: 6 (ref 5.0–8.0)

## 2024-06-29 LAB — CMP12+LP+TP+TSH+6AC+PSA+CBC…
ALT: 25 IU/L (ref 0–44)
AST: 20 IU/L (ref 0–40)
Albumin: 4.2 g/dL (ref 3.8–4.9)
Alkaline Phosphatase: 82 IU/L (ref 47–123)
BUN/Creatinine Ratio: 12 (ref 9–20)
BUN: 10 mg/dL (ref 6–24)
Basophils Absolute: 0 x10E3/uL (ref 0.0–0.2)
Basos: 0 %
Bilirubin Total: 0.6 mg/dL (ref 0.0–1.2)
Calcium: 9 mg/dL (ref 8.7–10.2)
Chloride: 104 mmol/L (ref 96–106)
Chol/HDL Ratio: 5.2 ratio — ABNORMAL HIGH (ref 0.0–5.0)
Cholesterol, Total: 224 mg/dL — ABNORMAL HIGH (ref 100–199)
Creatinine, Ser: 0.86 mg/dL (ref 0.76–1.27)
EOS (ABSOLUTE): 0.1 x10E3/uL (ref 0.0–0.4)
Eos: 2 %
Estimated CHD Risk: 1.1 times avg. — ABNORMAL HIGH (ref 0.0–1.0)
Free Thyroxine Index: 1.4 (ref 1.2–4.9)
GGT: 27 IU/L (ref 0–65)
Globulin, Total: 2.5 g/dL (ref 1.5–4.5)
Glucose: 120 mg/dL — ABNORMAL HIGH (ref 70–99)
HDL: 43 mg/dL (ref >39)
Hematocrit: 44.2 % (ref 37.5–51.0)
Hemoglobin: 14.7 g/dL (ref 13.0–17.7)
Immature Grans (Abs): 0.1 x10E3/uL (ref 0.0–0.1)
Immature Granulocytes: 1 %
Iron: 123 ug/dL (ref 38–169)
LDH: 162 IU/L (ref 121–224)
LDL Chol Calc (NIH): 161 mg/dL — ABNORMAL HIGH (ref 0–99)
Lymphocytes Absolute: 1.2 x10E3/uL (ref 0.7–3.1)
Lymphs: 33 %
MCH: 31.7 pg (ref 26.6–33.0)
MCHC: 33.3 g/dL (ref 31.5–35.7)
MCV: 96 fL (ref 79–97)
Monocytes Absolute: 0.4 x10E3/uL (ref 0.1–0.9)
Monocytes: 11 %
Neutrophils Absolute: 1.9 x10E3/uL (ref 1.4–7.0)
Neutrophils: 53 %
Phosphorus: 2.5 mg/dL — ABNORMAL LOW (ref 2.8–4.1)
Platelets: 232 x10E3/uL (ref 150–450)
Potassium: 4.3 mmol/L (ref 3.5–5.2)
Prostate Specific Ag, Serum: 1.2 ng/mL (ref 0.0–4.0)
RBC: 4.63 x10E6/uL (ref 4.14–5.80)
RDW: 12.2 % (ref 11.6–15.4)
Sodium: 137 mmol/L (ref 134–144)
T3 Uptake Ratio: 27 % (ref 24–39)
T4, Total: 5.1 ug/dL (ref 4.5–12.0)
TSH: 1.33 u[IU]/mL (ref 0.450–4.500)
Total Protein: 6.7 g/dL (ref 6.0–8.5)
Triglycerides: 112 mg/dL (ref 0–149)
Uric Acid: 5.9 mg/dL (ref 3.8–8.4)
VLDL Cholesterol Cal: 20 mg/dL (ref 5–40)
WBC: 3.6 x10E3/uL (ref 3.4–10.8)
eGFR: 100 mL/min/1.73 (ref >59)

## 2024-07-03 LAB — SPECIMEN STATUS REPORT

## 2024-07-03 LAB — HGB A1C W/O EAG: Hgb A1c MFr Bld: 6.1 % — ABNORMAL HIGH (ref 4.8–5.6)

## 2024-07-06 ENCOUNTER — Encounter: Payer: Self-pay | Admitting: Physician Assistant

## 2024-07-06 ENCOUNTER — Ambulatory Visit: Payer: Self-pay | Admitting: Physician Assistant

## 2024-07-06 VITALS — BP 132/72 | HR 64 | Temp 97.6°F | Resp 16 | Ht 70.0 in | Wt 220.0 lb

## 2024-07-06 DIAGNOSIS — Z0000 Encounter for general adult medical examination without abnormal findings: Secondary | ICD-10-CM

## 2024-07-06 NOTE — Progress Notes (Signed)
 [facility] ____________________________________________   None    (approximate)  I have reviewed the triage vital signs and the nursing notes.   HISTORY  Chief Complaint Annual Exam  HPI Francisco Harvey is a 58 y.o. male patient presents for annual physical exam.  Patient voiced concern for tickling in throat especially with prolonged conversations.         Past Medical History:  Diagnosis Date   Diverticulitis    Elevated LDL cholesterol level    Hypertension    Kidney stones    Migraines     Patient Active Problem List   Diagnosis Date Noted   Essential hypertension 09/30/2019   BMI 30.0-30.9,adult 09/30/2019    Past Surgical History:  Procedure Laterality Date   knee surgery     ROTATOR CUFF REPAIR     SHOULDER SURGERY      Prior to Admission medications   Medication Sig Start Date End Date Taking? Authorizing Provider  atenolol  (TENORMIN ) 50 MG tablet TAKE 1 TABLET (50 MG) IN THE MORNING AND 1/2 TABLET( 25 MG) IN THE EVENING 10/27/23   Claudene Tanda POUR, PA-C  atorvastatin  (LIPITOR) 10 MG tablet Take 1 tablet (10 mg total) by mouth daily. 11/07/23   Claudene Tanda POUR, PA-C  brompheniramine-pseudoephedrine-DM 30-2-10 MG/5ML syrup Take 5 mLs by mouth 4 (four) times daily as needed. 07/02/22   Claudene Tanda POUR, PA-C  naproxen  (NAPROSYN ) 500 MG tablet Take 1 tablet (500 mg total) by mouth 2 (two) times daily with a meal. 01/19/24   Claudene Tanda POUR, PA-C  orphenadrine  (NORFLEX ) 100 MG tablet Take 1 tablet (100 mg total) by mouth 2 (two) times daily. 01/19/24   Claudene Tanda POUR, PA-C  SUMAtriptan  (IMITREX ) 100 MG tablet May repeat in 2 hours if headache persists or recurs. 11/10/23   Claudene Tanda POUR, PA-C    Allergies Patient has no known allergies.  Family History  Problem Relation Age of Onset   Diabetes Mother    Heart disease Mother    Lung cancer Father        Non-smoker    Social History Social History   Tobacco Use    Smoking status: Never   Smokeless tobacco: Never  Substance Use Topics   Alcohol use: Not Currently   Drug use: Not Currently    Review of Systems Constitutional: No fever/chills Eyes: No visual changes. ENT: No sore throat. Cardiovascular: Denies chest pain. Respiratory: Denies shortness of breath. Gastrointestinal: No abdominal pain.  No nausea, no vomiting.  No diarrhea.  No constipation. Genitourinary: Negative for dysuria. Musculoskeletal: Negative for back pain. Skin: Negative for rash. Neurological: Negative for headaches, focal weakness or numbness. Endocrine: Hyperlipidemia hypertension   ____________________________________________   PHYSICAL EXAM:  VITAL SIGNS:   Constitutional: Alert and oriented. Well appearing and in no acute distress. Eyes: Conjunctivae are normal. PERRL. EOMI. Head: Atraumatic. Nose: No congestion/rhinnorhea. Mouth/Throat: Mucous membranes are moist.  Oropharynx non-erythematous. Neck: No stridor. No cervical spine tenderness to palpation. Hematological/Lymphatic/Immunilogical: No cervical lymphadenopathy. Cardiovascular: Normal rate, regular rhythm. Grossly normal heart sounds.  Good peripheral circulation. Respiratory: Normal respiratory effort.  No retractions. Lungs CTAB. Gastrointestinal: Soft and nontender. No distention. No abdominal bruits. No CVA tenderness. Genitourinary: Deferred Musculoskeletal: No lower extremity tenderness nor edema.  No joint effusions. Neurologic:  Normal speech and language. No gross focal neurologic deficits are appreciated. No gait instability. Skin:  Skin is warm, dry and intact. No rash noted. Psychiatric: Mood and affect are normal.  Speech and behavior are normal.  ____________________________________________   LABS        Component Ref Range & Units (hover) 8 d ago 1 yr ago 2 yr ago 3 yr ago  Hgb A1c MFr Bld 6.1 High  6.2 High  CM 6.1 High  CM 5.2 R  Comment:          Prediabetes: 5.7 - 6.4           Diabetes: >6.4          Glycemic control for adults with diabetes: <7.0  HbA1c POC (<> result, manual entry)                       Component Ref Range & Units (hover) 8 d ago (06/28/24) 1 yr ago (06/30/23) 2 yr ago (06/17/22) 2 yr ago (07/26/21) 3 yr ago (07/19/20) 5 yr ago (05/27/19) 15 yr ago (11/17/08)  Glucose 120 High  117 High  116 High  113 High  132 High  R 113 High  R   Uric Acid 5.9 5.1 CM 5.0 CM 5.5 CM 5.4 CM 5.1 R, CM   Comment:            Therapeutic target for gout patients: <6.0  BUN 10 15 10 10 11 16    Creatinine, Ser 0.86 0.85 0.97 0.97 0.94 0.89   eGFR 100 101 92 92     BUN/Creatinine Ratio 12 18 10 10 12 18    Sodium 137 138 137 139 142 138   Potassium 4.3 4.5 4.4 4.5 4.8 4.6   Chloride 104 104 102 102 104 104   Calcium  9.0 8.5 Low  8.8 9.0 9.3 8.6 Low    Phosphorus 2.5 Low  2.5 Low  2.6 Low  2.7 Low  2.5 Low  3.0   Total Protein 6.7 6.6 6.7 6.7 7.2 6.8   Albumin 4.2 4.3 4.4 4.5 4.7 4.4   Globulin, Total 2.5 2.3 2.3 2.2 2.5 2.4   Bilirubin Total 0.6 0.5 0.6 0.5 0.5 0.5   Alkaline Phosphatase 82 82 R 91 R 96 R 97 R, CM 87 R   LDH 162 167 163 171 184 162   AST 20 21 22 20 20 23    ALT 25 27 28 27 28 31    GGT 27 27 29 26 28 27    Iron 123 131 107 95 106 116   Cholesterol, Total 224 High  186 168 169 183 171   Triglycerides 112 114 135 93 139 105   HDL 43 44 38 Low  45 45 44   VLDL Cholesterol Cal 20 21 24 17 25 19    LDL Chol Calc (NIH) 161 High  121 High  106 High  107 High  113 High  108 High    Chol/HDL Ratio 5.2 High  4.2 CM 4.4 CM 3.8 CM 4.1 CM 3.9 CM   Comment:                                   T. Chol/HDL Ratio                                             Men  Women  1/2 Avg.Risk  3.4    3.3                                   Avg.Risk  5.0    4.4                                2X Avg.Risk  9.6    7.1                                3X Avg.Risk 23.4   11.0  Estimated CHD Risk 1.1 High  0.8 CM 0.9 CM 0.7 CM 0.8 CM 0.7 CM    Comment: The CHD Risk is based on the T. Chol/HDL ratio. Other factors affect CHD Risk such as hypertension, smoking, diabetes, severe obesity, and family history of premature CHD.  TSH 1.330 2.090 1.530 1.980 1.380 2.300   T4, Total 5.1 4.4 Low  4.6 4.6 4.5 4.3 Low    T3 Uptake Ratio 27 26 28 25 28 27    Free Thyroxine Index 1.4 1.1 Low  1.3 1.2 1.3 1.2   Prostate Specific Ag, Serum 1.2 1.3 CM 1.2 CM 1.0 CM 1.0 CM 0.9 CM   Comment: Roche ECLIA methodology. According to the American Urological Association, Serum PSA should decrease and remain at undetectable levels after radical prostatectomy. The AUA defines biochemical recurrence as an initial PSA value 0.2 ng/mL or greater followed by a subsequent confirmatory PSA value 0.2 ng/mL or greater. Values obtained with different assay methods or kits cannot be used interchangeably. Results cannot be interpreted as absolute evidence of the presence or absence of malignant disease.  WBC 3.6 4.0 4.0 4.4 4.0 4.4   RBC 4.63 4.45 4.70 5.00 4.94 4.69   Hemoglobin 14.7 14.4 15.2 15.8 16.1 15.0 15.1 R  Hematocrit 44.2 42.6 43.5 46.9 45.5 43.7   MCV 96 96 93 94 92 93   MCH 31.7 32.4 32.3 31.6 32.6 32.0   MCHC 33.3 33.8 34.9 33.7 35.4 34.3   RDW 12.2 12.3 12.5 12.3 12.3 12.2   Platelets 232 237 217 237 244 205   Neutrophils 53 53 53 56 58 59   Lymphs 33 35 34 30 31 30    Monocytes 11 10 10 8 9 9    Eos 2 2 2 5 2 2    Basos 0 0 1 1 0 0   Neutrophils Absolute 1.9 2.2 2.2 2.5 2.3 2.6   Lymphocytes Absolute 1.2 1.4 1.4 1.3 1.2 1.3   Monocytes Absolute 0.4 0.4 0.4 0.4 0.4 0.4   EOS (ABSOLUTE) 0.1 0.1 0.1 0.2 0.1 0.1   Basophils Absolute 0.0 0.0 0.0 0.0 0.0 0.0   Immature Granulocytes 1 0 0 0 0 0   Immature Grans (Abs) 0.1 0.0 0.0 0.0 0.0 0.0                         Component Ref Range & Units (hover) 8 d ago (06/28/24) 1 yr ago (06/30/23) 1 yr ago (11/27/22) 2 yr ago (06/17/22) 2 yr ago (07/26/21) 3 yr ago (07/19/20) 5 yr ago (05/27/19)   Color, UA Pale Yellow yellow yellow yellow yellow yellow Yellow  Clarity, UA Clear clear clear clear clear clear Clear  Glucose, UA Negative Negative Negative Negative Negative Negative Negative  Bilirubin, UA Negative neg neg negative negative negative Negative  Ketones, UA Negative neg neg neg negative negative Negative  Spec Grav, UA <=1.005 Abnormal  1.015 1.010 1.025 1.010 1.010 1.020  Blood, UA Negative neg neg 1+ negative negative Negative  pH, UA 6.0 5.0 6.0 6.0 7.0 7.0 6.0  Protein, UA Negative Negative Negative Negative Negative Negative Negative  Urobilinogen, UA 0.2 0.2 0.2 0.2 0.2 0.2 0.2  Nitrite, UA Negative neg neg neg negative negative Negative  Leukocytes, UA Negative Negative Negative Negative Negative Negative Negative  Appearance     light light   Odor                   ____________________________________________  EKG  Sinus bradycardic at 52 bpm ____________________________________________    ____________________________________________   INITIAL IMPRESSION / ASSESSMENT AND PLAN   As part of my medical decision making, I reviewed the following data within the electronic MEDICAL RECORD NUMBER         No acute findings on physical exam, EKG, or labs.     ____________________________________________   FINAL CLINICAL IMPRESSIONS  Well exam   ED Discharge Orders     None        Note:  This document was prepared using Dragon voice recognition software and may include unintentional dictation errors.

## 2024-08-16 DIAGNOSIS — G4489 Other headache syndrome: Secondary | ICD-10-CM
